# Patient Record
Sex: Male | Born: 1940
Health system: Southern US, Community
[De-identification: ages and names within clinical notes are randomized; demographics above are authoritative.]

## PROBLEM LIST (undated history)

## (undated) DIAGNOSIS — K219 Gastro-esophageal reflux disease without esophagitis: Secondary | ICD-10-CM

## (undated) DIAGNOSIS — B029 Zoster without complications: Secondary | ICD-10-CM

## (undated) DIAGNOSIS — M4800 Spinal stenosis, site unspecified: Secondary | ICD-10-CM

## (undated) DIAGNOSIS — M542 Cervicalgia: Secondary | ICD-10-CM

## (undated) DIAGNOSIS — S22079A Unspecified fracture of T9-T10 vertebra, initial encounter for closed fracture: Secondary | ICD-10-CM

## (undated) DIAGNOSIS — L719 Rosacea, unspecified: Secondary | ICD-10-CM

## (undated) DIAGNOSIS — G8929 Other chronic pain: Secondary | ICD-10-CM

## (undated) DIAGNOSIS — M1990 Unspecified osteoarthritis, unspecified site: Secondary | ICD-10-CM

## (undated) DIAGNOSIS — H409 Unspecified glaucoma: Secondary | ICD-10-CM

## (undated) DIAGNOSIS — N200 Calculus of kidney: Secondary | ICD-10-CM

## (undated) DIAGNOSIS — I10 Essential (primary) hypertension: Secondary | ICD-10-CM

## (undated) HISTORY — PX: ESOPHAGOGASTRODUODENOSCOPY: SHX1529

## (undated) HISTORY — DX: Unspecified osteoarthritis, unspecified site: M19.90

## (undated) HISTORY — DX: Other chronic pain: G89.29

## (undated) HISTORY — DX: Dorsalgia, unspecified: M54.2

## (undated) HISTORY — DX: Gastro-esophageal reflux disease without esophagitis: K21.9

## (undated) HISTORY — DX: Calculus of kidney: N20.0

## (undated) HISTORY — DX: Essential (primary) hypertension: I10

## (undated) HISTORY — DX: Rosacea, unspecified: L71.9

## (undated) HISTORY — DX: Zoster without complications: B02.9

## (undated) HISTORY — DX: Unspecified glaucoma: H40.9

## (undated) HISTORY — PX: COLONOSCOPY: SHX174

## (undated) HISTORY — DX: Unspecified fracture of t9-t10 vertebra, initial encounter for closed fracture: S22.079A

## (undated) HISTORY — DX: Spinal stenosis, site unspecified: M48.00

## (undated) HISTORY — PX: APPENDECTOMY: SHX54

---

## 1998-02-21 ENCOUNTER — Ambulatory Visit (HOSPITAL_COMMUNITY): Admission: RE | Admit: 1998-02-21 | Discharge: 1998-02-21 | Payer: Self-pay | Admitting: Gastroenterology

## 1998-03-06 ENCOUNTER — Ambulatory Visit (HOSPITAL_COMMUNITY): Admission: RE | Admit: 1998-03-06 | Discharge: 1998-03-06 | Payer: Self-pay | Admitting: Gastroenterology

## 1998-03-06 ENCOUNTER — Encounter: Payer: Self-pay | Admitting: Gastroenterology

## 1998-04-22 ENCOUNTER — Encounter: Payer: Self-pay | Admitting: Gastroenterology

## 1998-04-22 ENCOUNTER — Ambulatory Visit (HOSPITAL_COMMUNITY): Admission: RE | Admit: 1998-04-22 | Discharge: 1998-04-22 | Payer: Self-pay | Admitting: Gastroenterology

## 1998-06-06 ENCOUNTER — Ambulatory Visit (HOSPITAL_COMMUNITY): Admission: RE | Admit: 1998-06-06 | Discharge: 1998-06-06 | Payer: Self-pay | Admitting: Gastroenterology

## 1998-06-06 ENCOUNTER — Encounter: Payer: Self-pay | Admitting: Gastroenterology

## 2000-11-23 ENCOUNTER — Ambulatory Visit (HOSPITAL_COMMUNITY): Admission: RE | Admit: 2000-11-23 | Discharge: 2000-11-23 | Payer: Self-pay | Admitting: Gastroenterology

## 2000-11-23 ENCOUNTER — Encounter (INDEPENDENT_AMBULATORY_CARE_PROVIDER_SITE_OTHER): Payer: Self-pay | Admitting: *Deleted

## 2000-12-02 ENCOUNTER — Emergency Department (HOSPITAL_COMMUNITY): Admission: EM | Admit: 2000-12-02 | Discharge: 2000-12-02 | Payer: Self-pay | Admitting: Emergency Medicine

## 2000-12-02 ENCOUNTER — Encounter: Payer: Self-pay | Admitting: Emergency Medicine

## 2001-10-21 ENCOUNTER — Encounter: Payer: Self-pay | Admitting: Gastroenterology

## 2001-10-21 ENCOUNTER — Ambulatory Visit (HOSPITAL_COMMUNITY): Admission: RE | Admit: 2001-10-21 | Discharge: 2001-10-21 | Payer: Self-pay | Admitting: Gastroenterology

## 2001-12-09 ENCOUNTER — Ambulatory Visit (HOSPITAL_COMMUNITY): Admission: RE | Admit: 2001-12-09 | Discharge: 2001-12-09 | Payer: Self-pay | Admitting: Gastroenterology

## 2001-12-09 ENCOUNTER — Encounter: Payer: Self-pay | Admitting: Gastroenterology

## 2003-09-14 ENCOUNTER — Encounter (INDEPENDENT_AMBULATORY_CARE_PROVIDER_SITE_OTHER): Payer: Self-pay | Admitting: Specialist

## 2003-09-14 ENCOUNTER — Ambulatory Visit (HOSPITAL_COMMUNITY): Admission: RE | Admit: 2003-09-14 | Discharge: 2003-09-14 | Payer: Self-pay | Admitting: Gastroenterology

## 2004-10-29 ENCOUNTER — Encounter: Admission: RE | Admit: 2004-10-29 | Discharge: 2004-10-29 | Payer: Self-pay | Admitting: Gastroenterology

## 2005-08-04 ENCOUNTER — Encounter: Admission: RE | Admit: 2005-08-04 | Discharge: 2005-08-04 | Payer: Self-pay | Admitting: Family Medicine

## 2005-08-07 ENCOUNTER — Encounter: Admission: RE | Admit: 2005-08-07 | Discharge: 2005-08-07 | Payer: Self-pay | Admitting: Family Medicine

## 2005-10-13 ENCOUNTER — Encounter: Admission: RE | Admit: 2005-10-13 | Discharge: 2005-11-06 | Payer: Self-pay | Admitting: Family Medicine

## 2005-10-29 ENCOUNTER — Encounter: Admission: RE | Admit: 2005-10-29 | Discharge: 2005-10-29 | Payer: Self-pay | Admitting: Family Medicine

## 2005-11-24 ENCOUNTER — Encounter: Admission: RE | Admit: 2005-11-24 | Discharge: 2005-11-24 | Payer: Self-pay | Admitting: Family Medicine

## 2010-01-26 ENCOUNTER — Encounter: Payer: Self-pay | Admitting: Family Medicine

## 2010-05-23 NOTE — Op Note (Signed)
NAME:  Dylan Pierce, Dylan Pierce                         ACCOUNT NO.:  [id number]   MEDICAL RECORD NO.:  [id number]                   PATIENT TYPE:  AMB   LOCATION:  ENDO                                 FACILITY:  [facility]   PHYSICIAN:  Petra Kuba, M.D.                 DATE OF BIRTH:  [birth year]   DATE OF PROCEDURE:  09/14/2003  DATE OF DISCHARGE:                                 OPERATIVE REPORT   PROCEDURE:  Colonoscopy with polypectomy.   INDICATIONS:  Chronic right upper quadrant pain, due for colonic screening.   Consent was signed after risks, benefits, methods, options thoroughly  discussed in the office.   MEDICINES USED:  Demerol 70, Versed 7.   PROCEDURE:  Rectal inspection is pertinent for external hemorrhoids.  Digital exam was negative.  The video colonoscope was inserted and with some  difficulty due to some looping, with rolling on his back and with abdominal  pressure, was able to be advanced to the cecum.  On insertion, some left-  sided diverticula were seen but no other abnormalities.  The cecum was  identified by the appendiceal orifice and ileocecal valve.  The scope was  insert a shortways into the terminal ileum, which was normal.  Photo  documentation was obtained.  The scope was slowly withdrawn.  The prep was  adequate.  There was some liquid stool that required washing and suctioning.  Slow withdrawal through the colon, the cecum, and the ascending was normal.  In the hepatic flexure, a small pedunculated polyp was seen.  Snare  electrocautery applied.  The polyp was suctioned through the scope and  collected in a trap.  Just next to this polyp was a tiny polyp that was hot  biopsied x1 and put in the same container.  The scope was slowly withdrawn.  In the mid transverse, another tiny polyp was seen and was hot biopsied x1  and put in the same container.  On slow withdrawal through the remaining  part of the colon other than some left-sided diverticula, no other  abnormalities were seen.  Anorectal pull-through and retroflexion revealed  some small hemorrhoids.  The scope was straightened and readvanced shortways  up the left side of the colon.  Air was suctioned, and the scope removed.  Patient tolerated the procedure adequately.  There was no obvious immediate  complication.   ENDOSCOPIC DIAGNOSES:  1.  Internal and external hemorrhoids.  2.  Left-sided diverticula.  3.  Two tiny hepatic flexure and mid transverse polyps, hot biopsied.  4.  One small hepatic flexure polyp snare.  5.  Otherwise within normal limits to the terminal ileum.   PLAN:  Await pathology to determine future colonic screening.  Consider  repeat workup with either ultrasound, CAT scan, or possibly even a  pericholecystectomy.  Be happy to see back p.r.n., otherwise return to the  care of Dr. Dorothe Pea for other customary  health-care maintenance, to include  yearly rectals and guaiacs.                                               Petra Kuba, M.D.    MEM/MEDQ  D:  09/14/2003  T:  09/14/2003  Job:  [id number]   cc:   Jethro Bastos, M.D.  [address]  [place]  [place] [address]  Fax: [phone]

## 2010-05-23 NOTE — Procedures (Signed)
[facility]  Patient:    Dylan Pierce, Dylan Pierce Visit Number: [id number] MRN: [id number]          Service Type: END Location: ENDO Attending Physician:  Nelda Marseille Dictated by:   Petra Kuba, M.D. Proc. Date: 11/23/00 Admit Date:  11/23/2000   CC:         Jethro Bastos, M.D.   Procedure Report  PROCEDURE:  Esophagogastroduodenoscopy with pediatric colonoscope and biopsy.  SURGEON:  Petra Kuba, M.D.  INDICATIONS:  Patient with a borderline positive sprue antibody.  Multiple GI complaints.  Want to rule out sprue.  Consent was signed after risks, benefits, methods, and options were thoroughly discussed in the office on multiple occasions.  MEDICINES USED:  Demerol 50 and Versed 5.  DESCRIPTION OF PROCEDURE:  The pediatric video colonoscope was inserted by direct vision into the esophagus and easily advanced through his stomach through a normal pylorus into the duodenal bulb.  Pertinent for some mild bulbitis around the cilia to a normal duodenum, and around the duodenum to the proximal jejunum.  Photo documentation and biopsies were obtained.  On slow withdrawal back to the bulb, a few scattered duodenal biopsies were obtained as well.  There was no signs of obvious sprue.  The bulb was pertinent for some mild bulbitis, but no other abnormalities.  Once back in the stomach, the scope was retroflexed.  Pertinent for normal cardia, fundus, and angularis. Lesser and greater curve pertinent for some minimal gastritis.  The scope was straightened and straight visualization of the stomach confirmed some mild antritis and gastritis, but ruled out any other abnormalities.  Scattered biopsies of the stomach were obtained to rule out Helicobacter and confirm the gastritis.  Air was suctioned and the scope was slowly withdrawn.  Again, a good look at the esophagus was normal.  The scope was removed.  The patient tolerated the procedure well.   There was no obvious or immediate complication.  ENDOSCOPIC DIAGNOSES: 1. Small hiatal hernia. 2. Minimal gastritis, antritis, biopsy, and bulbitis, minimal. 3. Otherwise within normal limits to the proximal jejunum, status post biopsy    and duodenal biopsy to rule out sprue.  PLAN:  Await pathology, continue present management, consider even if biopsy is negative, and _________ for two months to see if it makes a difference, and will discuss that based on the biopsies.  Call me sooner p.r.n. Dictated by:   Petra Kuba, M.D. Attending Physician:  Nelda Marseille DD:  11/23/00 TD:  11/23/00 Job: [id number]

## 2011-02-10 ENCOUNTER — Encounter (INDEPENDENT_AMBULATORY_CARE_PROVIDER_SITE_OTHER): Payer: Medicare Other | Admitting: Ophthalmology

## 2011-02-10 DIAGNOSIS — H3530 Unspecified macular degeneration: Secondary | ICD-10-CM

## 2011-02-10 DIAGNOSIS — D3130 Benign neoplasm of unspecified choroid: Secondary | ICD-10-CM

## 2011-02-10 DIAGNOSIS — H35039 Hypertensive retinopathy, unspecified eye: Secondary | ICD-10-CM

## 2011-02-10 DIAGNOSIS — H4010X0 Unspecified open-angle glaucoma, stage unspecified: Secondary | ICD-10-CM

## 2011-02-10 DIAGNOSIS — H43819 Vitreous degeneration, unspecified eye: Secondary | ICD-10-CM

## 2011-02-10 DIAGNOSIS — I10 Essential (primary) hypertension: Secondary | ICD-10-CM

## 2012-02-01 ENCOUNTER — Other Ambulatory Visit: Payer: Self-pay | Admitting: Gastroenterology

## 2012-02-11 ENCOUNTER — Ambulatory Visit (INDEPENDENT_AMBULATORY_CARE_PROVIDER_SITE_OTHER): Payer: Medicare Other | Admitting: Ophthalmology

## 2013-02-24 ENCOUNTER — Other Ambulatory Visit: Payer: Self-pay | Admitting: Family Medicine

## 2013-02-24 ENCOUNTER — Ambulatory Visit
Admission: RE | Admit: 2013-02-24 | Discharge: 2013-02-24 | Disposition: A | Discharge status: Home / Self Care | Payer: Medicare Other | Source: Ambulatory Visit | Attending: Family Medicine | Admitting: Family Medicine

## 2013-02-24 DIAGNOSIS — R1032 Left lower quadrant pain: Secondary | ICD-10-CM

## 2013-02-24 MED ORDER — IOHEXOL 300 MG/ML  SOLN
100.0000 mL | Freq: Once | INTRAMUSCULAR | Status: AC | PRN
  Administered 2013-02-24: 100 mL via INTRAVENOUS

## 2013-05-18 ENCOUNTER — Other Ambulatory Visit: Payer: Self-pay | Admitting: Family Medicine

## 2013-05-18 DIAGNOSIS — M545 Low back pain, unspecified: Secondary | ICD-10-CM

## 2013-05-18 DIAGNOSIS — M5430 Sciatica, unspecified side: Secondary | ICD-10-CM

## 2013-05-26 ENCOUNTER — Ambulatory Visit
Admission: RE | Admit: 2013-05-26 | Discharge: 2013-05-26 | Disposition: A | Discharge status: Home / Self Care | Payer: Medicare Other | Source: Ambulatory Visit | Attending: Family Medicine | Admitting: Family Medicine

## 2013-05-26 DIAGNOSIS — M5430 Sciatica, unspecified side: Secondary | ICD-10-CM

## 2013-05-26 DIAGNOSIS — M545 Low back pain, unspecified: Secondary | ICD-10-CM

## 2014-09-03 ENCOUNTER — Other Ambulatory Visit: Payer: Self-pay | Admitting: Family Medicine

## 2014-09-03 ENCOUNTER — Ambulatory Visit
Admission: RE | Admit: 2014-09-03 | Discharge: 2014-09-03 | Disposition: A | Discharge status: Home / Self Care | Payer: Medicare Other | Source: Ambulatory Visit | Attending: Family Medicine | Admitting: Family Medicine

## 2014-09-03 DIAGNOSIS — J209 Acute bronchitis, unspecified: Secondary | ICD-10-CM

## 2015-05-28 DIAGNOSIS — L719 Rosacea, unspecified: Secondary | ICD-10-CM | POA: Diagnosis not present

## 2015-05-28 DIAGNOSIS — J309 Allergic rhinitis, unspecified: Secondary | ICD-10-CM | POA: Diagnosis not present

## 2015-05-28 DIAGNOSIS — G4700 Insomnia, unspecified: Secondary | ICD-10-CM | POA: Diagnosis not present

## 2015-05-28 DIAGNOSIS — E663 Overweight: Secondary | ICD-10-CM | POA: Diagnosis not present

## 2015-05-28 DIAGNOSIS — D582 Other hemoglobinopathies: Secondary | ICD-10-CM | POA: Diagnosis not present

## 2015-05-28 DIAGNOSIS — I10 Essential (primary) hypertension: Secondary | ICD-10-CM | POA: Diagnosis not present

## 2015-05-28 DIAGNOSIS — M545 Low back pain: Secondary | ICD-10-CM | POA: Diagnosis not present

## 2015-05-28 DIAGNOSIS — K219 Gastro-esophageal reflux disease without esophagitis: Secondary | ICD-10-CM | POA: Diagnosis not present

## 2015-06-04 DIAGNOSIS — H401221 Low-tension glaucoma, left eye, mild stage: Secondary | ICD-10-CM | POA: Diagnosis not present

## 2015-06-04 DIAGNOSIS — H2513 Age-related nuclear cataract, bilateral: Secondary | ICD-10-CM | POA: Diagnosis not present

## 2015-06-04 DIAGNOSIS — H524 Presbyopia: Secondary | ICD-10-CM | POA: Diagnosis not present

## 2015-06-04 DIAGNOSIS — H401213 Low-tension glaucoma, right eye, severe stage: Secondary | ICD-10-CM | POA: Diagnosis not present

## 2015-11-15 DIAGNOSIS — Z23 Encounter for immunization: Secondary | ICD-10-CM | POA: Diagnosis not present

## 2015-12-03 DIAGNOSIS — K219 Gastro-esophageal reflux disease without esophagitis: Secondary | ICD-10-CM | POA: Diagnosis not present

## 2015-12-03 DIAGNOSIS — Z0000 Encounter for general adult medical examination without abnormal findings: Secondary | ICD-10-CM | POA: Diagnosis not present

## 2015-12-03 DIAGNOSIS — I10 Essential (primary) hypertension: Secondary | ICD-10-CM | POA: Diagnosis not present

## 2015-12-03 DIAGNOSIS — Z1211 Encounter for screening for malignant neoplasm of colon: Secondary | ICD-10-CM | POA: Diagnosis not present

## 2015-12-03 DIAGNOSIS — L719 Rosacea, unspecified: Secondary | ICD-10-CM | POA: Diagnosis not present

## 2015-12-03 DIAGNOSIS — J309 Allergic rhinitis, unspecified: Secondary | ICD-10-CM | POA: Diagnosis not present

## 2015-12-03 DIAGNOSIS — Z8719 Personal history of other diseases of the digestive system: Secondary | ICD-10-CM | POA: Diagnosis not present

## 2015-12-03 DIAGNOSIS — H409 Unspecified glaucoma: Secondary | ICD-10-CM | POA: Diagnosis not present

## 2015-12-03 DIAGNOSIS — M545 Low back pain: Secondary | ICD-10-CM | POA: Diagnosis not present

## 2015-12-03 DIAGNOSIS — G4700 Insomnia, unspecified: Secondary | ICD-10-CM | POA: Diagnosis not present

## 2015-12-10 DIAGNOSIS — H401322 Pigmentary glaucoma, left eye, moderate stage: Secondary | ICD-10-CM | POA: Diagnosis not present

## 2015-12-10 DIAGNOSIS — H401313 Pigmentary glaucoma, right eye, severe stage: Secondary | ICD-10-CM | POA: Diagnosis not present

## 2016-02-18 DIAGNOSIS — R040 Epistaxis: Secondary | ICD-10-CM | POA: Diagnosis not present

## 2016-02-18 DIAGNOSIS — J343 Hypertrophy of nasal turbinates: Secondary | ICD-10-CM | POA: Diagnosis not present

## 2016-05-05 DIAGNOSIS — H353112 Nonexudative age-related macular degeneration, right eye, intermediate dry stage: Secondary | ICD-10-CM | POA: Diagnosis not present

## 2016-05-05 DIAGNOSIS — H401321 Pigmentary glaucoma, left eye, mild stage: Secondary | ICD-10-CM | POA: Diagnosis not present

## 2016-05-05 DIAGNOSIS — H25013 Cortical age-related cataract, bilateral: Secondary | ICD-10-CM | POA: Diagnosis not present

## 2016-05-05 DIAGNOSIS — H401313 Pigmentary glaucoma, right eye, severe stage: Secondary | ICD-10-CM | POA: Diagnosis not present

## 2016-06-02 DIAGNOSIS — I10 Essential (primary) hypertension: Secondary | ICD-10-CM | POA: Diagnosis not present

## 2016-06-02 DIAGNOSIS — G4700 Insomnia, unspecified: Secondary | ICD-10-CM | POA: Diagnosis not present

## 2016-06-02 DIAGNOSIS — M545 Low back pain: Secondary | ICD-10-CM | POA: Diagnosis not present

## 2016-06-02 DIAGNOSIS — K219 Gastro-esophageal reflux disease without esophagitis: Secondary | ICD-10-CM | POA: Diagnosis not present

## 2016-06-16 DIAGNOSIS — H401313 Pigmentary glaucoma, right eye, severe stage: Secondary | ICD-10-CM | POA: Diagnosis not present

## 2016-10-29 DIAGNOSIS — Z23 Encounter for immunization: Secondary | ICD-10-CM | POA: Diagnosis not present

## 2016-12-07 DIAGNOSIS — Z0000 Encounter for general adult medical examination without abnormal findings: Secondary | ICD-10-CM | POA: Diagnosis not present

## 2016-12-07 DIAGNOSIS — I10 Essential (primary) hypertension: Secondary | ICD-10-CM | POA: Diagnosis not present

## 2016-12-07 DIAGNOSIS — G4700 Insomnia, unspecified: Secondary | ICD-10-CM | POA: Diagnosis not present

## 2016-12-07 DIAGNOSIS — K219 Gastro-esophageal reflux disease without esophagitis: Secondary | ICD-10-CM | POA: Diagnosis not present

## 2017-01-13 DIAGNOSIS — R0781 Pleurodynia: Secondary | ICD-10-CM | POA: Diagnosis not present

## 2017-01-25 ENCOUNTER — Ambulatory Visit
Admission: RE | Admit: 2017-01-25 | Discharge: 2017-01-25 | Disposition: A | Discharge status: Home / Self Care | Payer: Medicare Other | Source: Ambulatory Visit | Attending: Family Medicine | Admitting: Family Medicine

## 2017-01-25 ENCOUNTER — Other Ambulatory Visit: Payer: Self-pay | Admitting: Family Medicine

## 2017-01-25 DIAGNOSIS — I10 Essential (primary) hypertension: Secondary | ICD-10-CM | POA: Diagnosis not present

## 2017-01-25 DIAGNOSIS — M47814 Spondylosis without myelopathy or radiculopathy, thoracic region: Secondary | ICD-10-CM | POA: Diagnosis not present

## 2017-01-25 DIAGNOSIS — M546 Pain in thoracic spine: Secondary | ICD-10-CM

## 2017-01-25 DIAGNOSIS — R0781 Pleurodynia: Secondary | ICD-10-CM | POA: Diagnosis not present

## 2017-01-25 DIAGNOSIS — R0789 Other chest pain: Secondary | ICD-10-CM

## 2017-06-07 DIAGNOSIS — K219 Gastro-esophageal reflux disease without esophagitis: Secondary | ICD-10-CM | POA: Diagnosis not present

## 2017-06-07 DIAGNOSIS — M545 Low back pain: Secondary | ICD-10-CM | POA: Diagnosis not present

## 2017-06-07 DIAGNOSIS — I10 Essential (primary) hypertension: Secondary | ICD-10-CM | POA: Diagnosis not present

## 2017-06-07 DIAGNOSIS — G4700 Insomnia, unspecified: Secondary | ICD-10-CM | POA: Diagnosis not present

## 2017-06-11 DIAGNOSIS — H401321 Pigmentary glaucoma, left eye, mild stage: Secondary | ICD-10-CM | POA: Diagnosis not present

## 2017-06-11 DIAGNOSIS — H401313 Pigmentary glaucoma, right eye, severe stage: Secondary | ICD-10-CM | POA: Diagnosis not present

## 2017-10-12 DIAGNOSIS — H401321 Pigmentary glaucoma, left eye, mild stage: Secondary | ICD-10-CM | POA: Diagnosis not present

## 2017-10-12 DIAGNOSIS — H25013 Cortical age-related cataract, bilateral: Secondary | ICD-10-CM | POA: Diagnosis not present

## 2017-10-12 DIAGNOSIS — H2513 Age-related nuclear cataract, bilateral: Secondary | ICD-10-CM | POA: Diagnosis not present

## 2017-10-12 DIAGNOSIS — H25043 Posterior subcapsular polar age-related cataract, bilateral: Secondary | ICD-10-CM | POA: Diagnosis not present

## 2017-10-21 DIAGNOSIS — H25012 Cortical age-related cataract, left eye: Secondary | ICD-10-CM | POA: Diagnosis not present

## 2017-10-21 DIAGNOSIS — H25812 Combined forms of age-related cataract, left eye: Secondary | ICD-10-CM | POA: Diagnosis not present

## 2017-10-21 DIAGNOSIS — H25042 Posterior subcapsular polar age-related cataract, left eye: Secondary | ICD-10-CM | POA: Diagnosis not present

## 2017-10-21 DIAGNOSIS — H2512 Age-related nuclear cataract, left eye: Secondary | ICD-10-CM | POA: Diagnosis not present

## 2017-11-04 DIAGNOSIS — H25041 Posterior subcapsular polar age-related cataract, right eye: Secondary | ICD-10-CM | POA: Diagnosis not present

## 2017-11-04 DIAGNOSIS — H25811 Combined forms of age-related cataract, right eye: Secondary | ICD-10-CM | POA: Diagnosis not present

## 2017-11-04 DIAGNOSIS — H2511 Age-related nuclear cataract, right eye: Secondary | ICD-10-CM | POA: Diagnosis not present

## 2017-11-04 DIAGNOSIS — H25011 Cortical age-related cataract, right eye: Secondary | ICD-10-CM | POA: Diagnosis not present

## 2017-12-10 DIAGNOSIS — Z961 Presence of intraocular lens: Secondary | ICD-10-CM | POA: Diagnosis not present

## 2018-01-11 DIAGNOSIS — I10 Essential (primary) hypertension: Secondary | ICD-10-CM | POA: Diagnosis not present

## 2018-01-11 DIAGNOSIS — M545 Low back pain: Secondary | ICD-10-CM | POA: Diagnosis not present

## 2018-01-11 DIAGNOSIS — G4700 Insomnia, unspecified: Secondary | ICD-10-CM | POA: Diagnosis not present

## 2018-01-11 DIAGNOSIS — Z0000 Encounter for general adult medical examination without abnormal findings: Secondary | ICD-10-CM | POA: Diagnosis not present

## 2018-05-09 DIAGNOSIS — H401313 Pigmentary glaucoma, right eye, severe stage: Secondary | ICD-10-CM | POA: Diagnosis not present

## 2018-05-09 DIAGNOSIS — H401321 Pigmentary glaucoma, left eye, mild stage: Secondary | ICD-10-CM | POA: Diagnosis not present

## 2018-05-09 DIAGNOSIS — H00012 Hordeolum externum right lower eyelid: Secondary | ICD-10-CM | POA: Diagnosis not present

## 2018-07-14 DIAGNOSIS — M545 Low back pain: Secondary | ICD-10-CM | POA: Diagnosis not present

## 2018-07-14 DIAGNOSIS — G4700 Insomnia, unspecified: Secondary | ICD-10-CM | POA: Diagnosis not present

## 2018-07-14 DIAGNOSIS — I10 Essential (primary) hypertension: Secondary | ICD-10-CM | POA: Diagnosis not present

## 2018-07-14 DIAGNOSIS — K5792 Diverticulitis of intestine, part unspecified, without perforation or abscess without bleeding: Secondary | ICD-10-CM | POA: Diagnosis not present

## 2018-09-13 DIAGNOSIS — H43813 Vitreous degeneration, bilateral: Secondary | ICD-10-CM | POA: Diagnosis not present

## 2018-09-13 DIAGNOSIS — H401313 Pigmentary glaucoma, right eye, severe stage: Secondary | ICD-10-CM | POA: Diagnosis not present

## 2018-09-13 DIAGNOSIS — H353112 Nonexudative age-related macular degeneration, right eye, intermediate dry stage: Secondary | ICD-10-CM | POA: Diagnosis not present

## 2019-01-23 DIAGNOSIS — Z0000 Encounter for general adult medical examination without abnormal findings: Secondary | ICD-10-CM | POA: Diagnosis not present

## 2019-01-23 DIAGNOSIS — I10 Essential (primary) hypertension: Secondary | ICD-10-CM | POA: Diagnosis not present

## 2019-01-23 DIAGNOSIS — G4700 Insomnia, unspecified: Secondary | ICD-10-CM | POA: Diagnosis not present

## 2019-01-23 DIAGNOSIS — M545 Low back pain: Secondary | ICD-10-CM | POA: Diagnosis not present

## 2019-02-09 DIAGNOSIS — I10 Essential (primary) hypertension: Secondary | ICD-10-CM | POA: Diagnosis not present

## 2019-02-18 ENCOUNTER — Ambulatory Visit: Payer: Medicare Other | Attending: Internal Medicine

## 2019-02-18 DIAGNOSIS — Z23 Encounter for immunization: Secondary | ICD-10-CM | POA: Insufficient documentation

## 2019-02-18 NOTE — Progress Notes (Signed)
   Covid-19 Vaccination Clinic  Name:  Dylan Pierce    MRN: [id number] DOB: [birth year]  02/18/2019  [name] was observed post Covid-19 immunization for 15 minutes without incidence. He was provided with Vaccine Information Sheet and instruction to access the V-Safe system.   [name] was instructed to call 911 with any severe reactions post vaccine: Marland Kitchen Difficulty breathing  . Swelling of your face and throat  . A fast heartbeat  . A bad rash all over your body  . Dizziness and weakness    Immunizations Administered    Name Date Dose VIS Date Route   Pfizer COVID-19 Vaccine 02/18/2019  9:39 AM 0.3 mL 12/16/2018 Intramuscular   Manufacturer: [place]   Lot: [id number]   [place]: [id number]

## 2019-03-08 DIAGNOSIS — H401321 Pigmentary glaucoma, left eye, mild stage: Secondary | ICD-10-CM | POA: Diagnosis not present

## 2019-03-08 DIAGNOSIS — H401313 Pigmentary glaucoma, right eye, severe stage: Secondary | ICD-10-CM | POA: Diagnosis not present

## 2019-03-12 ENCOUNTER — Ambulatory Visit: Payer: Medicare Other | Attending: Internal Medicine

## 2019-03-12 DIAGNOSIS — Z23 Encounter for immunization: Secondary | ICD-10-CM | POA: Insufficient documentation

## 2019-03-12 NOTE — Progress Notes (Signed)
   Covid-19 Vaccination Clinic  Name:  Dylan Pierce    MRN: [id number] DOB: April 01, 1940  03/12/2019  Dylan Pierce was observed post Covid-19 immunization for 15 minutes without incident. He was provided with Vaccine Information Sheet and instruction to access the V-Safe system.   Dylan Pierce was instructed to call 911 with any severe reactions post vaccine: Marland Kitchen Difficulty breathing  . Swelling of face and throat  . A fast heartbeat  . A bad rash all over body  . Dizziness and weakness   Immunizations Administered    Name Date Dose VIS Date Route   Pfizer COVID-19 Vaccine 03/12/2019  2:19 PM 0.3 mL 12/16/2018 Intramuscular   Manufacturer: [place]   Lot: [id number]   [place]: [id number]

## 2019-04-06 DIAGNOSIS — Z0120 Encounter for dental examination and cleaning without abnormal findings: Secondary | ICD-10-CM | POA: Diagnosis not present

## 2019-04-18 DIAGNOSIS — D582 Other hemoglobinopathies: Secondary | ICD-10-CM | POA: Diagnosis not present

## 2019-04-18 DIAGNOSIS — G4700 Insomnia, unspecified: Secondary | ICD-10-CM | POA: Diagnosis not present

## 2019-04-18 DIAGNOSIS — H409 Unspecified glaucoma: Secondary | ICD-10-CM | POA: Diagnosis not present

## 2019-04-18 DIAGNOSIS — I10 Essential (primary) hypertension: Secondary | ICD-10-CM | POA: Diagnosis not present

## 2019-05-05 DIAGNOSIS — R040 Epistaxis: Secondary | ICD-10-CM | POA: Diagnosis not present

## 2019-05-30 DIAGNOSIS — R040 Epistaxis: Secondary | ICD-10-CM | POA: Diagnosis not present

## 2019-06-28 DIAGNOSIS — R040 Epistaxis: Secondary | ICD-10-CM | POA: Diagnosis not present

## 2019-09-01 DIAGNOSIS — G4700 Insomnia, unspecified: Secondary | ICD-10-CM | POA: Diagnosis not present

## 2019-09-01 DIAGNOSIS — M545 Low back pain: Secondary | ICD-10-CM | POA: Diagnosis not present

## 2019-09-01 DIAGNOSIS — L719 Rosacea, unspecified: Secondary | ICD-10-CM | POA: Diagnosis not present

## 2019-09-01 DIAGNOSIS — I10 Essential (primary) hypertension: Secondary | ICD-10-CM | POA: Diagnosis not present

## 2019-09-05 DIAGNOSIS — I10 Essential (primary) hypertension: Secondary | ICD-10-CM | POA: Diagnosis not present

## 2019-09-05 DIAGNOSIS — G4700 Insomnia, unspecified: Secondary | ICD-10-CM | POA: Diagnosis not present

## 2019-09-05 DIAGNOSIS — D582 Other hemoglobinopathies: Secondary | ICD-10-CM | POA: Diagnosis not present

## 2019-09-05 DIAGNOSIS — H409 Unspecified glaucoma: Secondary | ICD-10-CM | POA: Diagnosis not present

## 2019-09-13 DIAGNOSIS — H26493 Other secondary cataract, bilateral: Secondary | ICD-10-CM | POA: Diagnosis not present

## 2019-09-13 DIAGNOSIS — H401313 Pigmentary glaucoma, right eye, severe stage: Secondary | ICD-10-CM | POA: Diagnosis not present

## 2019-09-13 DIAGNOSIS — H35372 Puckering of macula, left eye: Secondary | ICD-10-CM | POA: Diagnosis not present

## 2019-09-13 DIAGNOSIS — H524 Presbyopia: Secondary | ICD-10-CM | POA: Diagnosis not present

## 2019-10-05 DIAGNOSIS — H26491 Other secondary cataract, right eye: Secondary | ICD-10-CM | POA: Diagnosis not present

## 2019-11-16 DIAGNOSIS — H26492 Other secondary cataract, left eye: Secondary | ICD-10-CM | POA: Diagnosis not present
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
## 2020-02-09 DIAGNOSIS — L719 Rosacea, unspecified: Secondary | ICD-10-CM | POA: Diagnosis not present

## 2020-02-09 DIAGNOSIS — I10 Essential (primary) hypertension: Secondary | ICD-10-CM | POA: Diagnosis not present

## 2020-02-09 DIAGNOSIS — Z0000 Encounter for general adult medical examination without abnormal findings: Secondary | ICD-10-CM | POA: Diagnosis not present

## 2020-02-09 DIAGNOSIS — G4700 Insomnia, unspecified: Secondary | ICD-10-CM | POA: Diagnosis not present

## 2020-03-22 DIAGNOSIS — G8929 Other chronic pain: Secondary | ICD-10-CM | POA: Diagnosis not present

## 2020-03-22 DIAGNOSIS — Z6828 Body mass index (BMI) 28.0-28.9, adult: Secondary | ICD-10-CM | POA: Diagnosis not present

## 2020-03-22 DIAGNOSIS — M545 Low back pain, unspecified: Secondary | ICD-10-CM | POA: Diagnosis not present

## 2020-03-22 DIAGNOSIS — I10 Essential (primary) hypertension: Secondary | ICD-10-CM | POA: Diagnosis not present

## 2020-04-02 ENCOUNTER — Ambulatory Visit: Payer: Medicare Other | Attending: Neurosurgery | Admitting: Physical Therapy

## 2020-04-02 ENCOUNTER — Encounter: Payer: Self-pay | Admitting: Physical Therapy

## 2020-04-02 ENCOUNTER — Other Ambulatory Visit: Payer: Self-pay

## 2020-04-02 DIAGNOSIS — G8929 Other chronic pain: Secondary | ICD-10-CM | POA: Diagnosis not present

## 2020-04-02 DIAGNOSIS — M545 Low back pain, unspecified: Secondary | ICD-10-CM | POA: Diagnosis not present

## 2020-04-02 DIAGNOSIS — R252 Cramp and spasm: Secondary | ICD-10-CM | POA: Diagnosis not present
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
## 2020-04-02 NOTE — Addendum Note (Signed)
Addended by: Abelino Derrick on: 04/02/2020 02:37 PM   Modules accepted: Orders

## 2020-04-04 ENCOUNTER — Encounter: Payer: Self-pay | Admitting: Physical Therapy

## 2020-04-04 ENCOUNTER — Other Ambulatory Visit: Payer: Self-pay

## 2020-04-04 ENCOUNTER — Ambulatory Visit: Payer: Medicare Other | Admitting: Physical Therapy

## 2020-04-04 DIAGNOSIS — M545 Low back pain, unspecified: Secondary | ICD-10-CM | POA: Diagnosis not present

## 2020-04-04 DIAGNOSIS — G8929 Other chronic pain: Secondary | ICD-10-CM

## 2020-04-04 DIAGNOSIS — R252 Cramp and spasm: Secondary | ICD-10-CM

## 2020-04-04 NOTE — Therapy (Signed)
[facility] [address] [place], [place], [address] Phone: [phone]   Fax:  [phone]  Physical Therapy Treatment  Patient Details  Name: Dylan Pierce MRN: [id number] Date of Birth: 1940-02-24 Referring Provider (PT): Duffy Rhody, MD   Encounter Date: 04/04/2020   PT End of Session - 04/04/20 0939    Visit Number 2    Number of Visits 16    Date for PT Re-Evaluation 05/28/20    Authorization Type Medicare    Progress Note Due on Visit 10    PT Start Time 0800    PT Stop Time 0846    PT Time Calculation (min) 46 min           Past Medical History:  Diagnosis Date  . Arthritis   . Hypertension     Past Surgical History:  Procedure Laterality Date  . APPENDECTOMY      There were no vitals filed for this visit.   Subjective Assessment - 04/04/20 0801    Pertinent History arthritis, hypertension    Limitations Lifting    How long can you sit comfortably? unlimited    How long can you stand comfortably? unlimited    How long can you walk comfortably? 20 minutes    Patient Stated Goals get back to yard work and to be able to continue to drive as wife is unable    Currently in Pain? Yes    Pain Score 4     Pain Location Back    Pain Orientation Right;Mid;Lower    Pain Descriptors / Indicators Sore    Pain Type Acute pain;Chronic pain    Pain Onset 1 to 4 weeks ago    Pain Frequency Constant    Multiple Pain Sites No                             [facility] Adult PT Treatment/Exercise - 04/04/20 0001      Exercises   Exercises Lumbar      Lumbar Exercises: Stretches   Active Hamstring Stretch Right;Left;20 seconds    Active Hamstring Stretch Limitations seated    Single Knee to Chest Stretch Right;Left;1 rep;20 seconds    Lower Trunk Rotation 5 reps    Lower Trunk Rotation Limitations bil    Piriformis Stretch Right;Left;1 rep;20 seconds    Other Lumbar Stretch  Exercise seated ball roll into flexion x10      Lumbar Exercises: Supine   Ab Set 10 reps;3 seconds    AB Set Limitations VC for continued breathing    Pelvic Tilt 5 reps;5 seconds    Pelvic Tilt Limitations TC for posterior tilt      Lumbar Exercises: Sidelying   Other Sidelying Lumbar Exercises thoracic rotation x3 bil      Modalities   Modalities Moist Heat      Moist Heat Therapy   Number Minutes Moist Heat 5 Minutes    Moist Heat Location Lumbar Spine   between thoracic and lumbar spine     Manual Therapy   Manual Therapy Soft tissue mobilization    Soft tissue mobilization skilled palpation for assessment of tPR and increased tension within muscle; assessement of tissues response during DN            Trigger Point Dry Needling - 04/04/20 0001    Consent Given? Yes    Education Handout Provided Previously provided   verbal education provided  previous session   Muscles Treated Back/Hip Lumbar multifidi;Thoracic multifidi    Other Dry Needling bil    Lumbar multifidi Response Palpable increased muscle length    Thoracic multifidi response Palpable increased muscle length                PT Education - 04/04/20 0939    Education Details Access Code: [facility]    Person(s) Educated Patient    Methods Explanation;Demonstration;Tactile cues;Verbal cues;Handout    Comprehension Verbalized understanding;Returned demonstration;Verbal cues required;Tactile cues required            PT Short Term Goals - 04/02/20 1417      PT SHORT TERM GOAL #1   Title Patient will be independent with HEP for continued progression at home.    Time 4    Period Weeks    Status New    Target Date 04/30/20      PT SHORT TERM GOAL #2   Title Patient will report no more than 3/10 pain at rest for improved functional activity tolerance    Time 4    Period Weeks    Status New    Target Date 04/30/20      PT SHORT TERM GOAL #3   Title Patient will demonstrate 50% lumbar extension  ROM to more readily complete ADLs.    Time 4    Period Weeks    Status New    Target Date 04/30/20             PT Long Term Goals - 04/02/20 1422      PT LONG TERM GOAL #1   Title Patient will be independent with advanced HEP for long term management of symptoms post D/C.    Time 8    Period Weeks    Status New    Target Date 05/28/20      PT LONG TERM GOAL #2   Title Patient will lift 10# from floor level using proper mechanics x5 reps without increased pain to more readily complete yardwork and homemaking duties.    Time 8    Period Weeks    Status New    Target Date 05/28/20      PT LONG TERM GOAL #3   Title Patient will improve FOTO score to 68 or higher to indicate improvd overall function.    Time 8    Period Weeks    Status New    Target Date 05/28/20      PT LONG TERM GOAL #4   Title Patient will roll Lt and Rt without increased pain for improved bed mobility.    Time 8    Period Weeks    Status New    Target Date 05/28/20                 Plan - 04/04/20 0940    Clinical Impression Statement Patient reports continued pain spanning L1 to T9. Continues to describe pain as soft tissue related. Noting increased sensitivity to lumbar rotation and extension this date. Patient reporting increased pain when rolling Lt/Rt. Reporting decreased pain when cued for ab set prior to rolling. Verbal cues for maintaing knee extension when performing seated hamstring stretch. Requiring pillow under torso to maintain prone positioning during DN. Would benefit from continued intervention to address impairments for decreaesd pain and improved functional mobility.    Personal Factors and Comorbidities Age;Comorbidity 2    Comorbidities arthritis, hypertension    Examination-Activity Limitations Bed Mobility;Bend;Lift    Examination-Participation  Restrictions Cleaning;Yard Work    Rehab Potential Good    PT Frequency 2x / week    PT Duration 8 weeks    PT  Treatment/Interventions ADLs/Self Care Home Management;Cryotherapy;Electrical Stimulation;Iontophoresis 4mg /ml Dexamethasone;Moist Heat;Traction;Functional mobility training;Therapeutic activities;Therapeutic exercise;Neuromuscular re-education;Patient/family education;Manual techniques;Dry needling;Passive range of motion    PT Next Visit Plan review HP; assess response to DN and continue as needed; continue lumbar mobility; add glute/core strength to patient tolerance    PT Home Exercise Plan Access Code: [email or web address]    Consulted and Agree with Plan of Care Patient           Patient will benefit from skilled therapeutic intervention in order to improve the following deficits and impairments:  Decreased activity tolerance,Decreased mobility,Decreased range of motion,Decreased strength,Hypomobility,Increased muscle spasms,Postural dysfunction  Visit Diagnosis: Cramp and spasm  Chronic bilateral low back pain without sciatica     Problem List There are no problems to display for this patient.  Everardo All PT, DPT  04/04/20 9:46 AM   [facility] [address], [place], [place], [address] Phone: [phone]   Fax:  [phone]  Name: Dylan Pierce MRN: [id number] Date of Birth: 1940-05-07

## 2020-04-04 NOTE — Patient Instructions (Signed)
Access Code: [facility] URL: [email or web address] Date: 04/04/2020 Prepared by: Everardo All  Exercises Supine Lower Trunk Rotation - 2 x daily - 7 x weekly - 1 sets - 10 reps Seated Hamstring Stretch - 2 x daily - 7 x weekly - 1 sets - 2 reps - 20s hold Supine Single Knee to Chest - 2 x daily - 7 x weekly - 1 sets - 2 reps - 20s hold Supine Piriformis Stretch with Leg Straight - 2 x daily - 7 x weekly - 1 sets - 2 reps - 20s hold

## 2020-04-10 DIAGNOSIS — H401313 Pigmentary glaucoma, right eye, severe stage: Secondary | ICD-10-CM | POA: Diagnosis not present

## 2020-04-11 ENCOUNTER — Other Ambulatory Visit: Payer: Self-pay

## 2020-04-11 ENCOUNTER — Encounter: Payer: Self-pay | Admitting: Physical Therapy

## 2020-04-11 ENCOUNTER — Ambulatory Visit: Payer: Medicare Other | Attending: Neurosurgery | Admitting: Physical Therapy

## 2020-04-11 DIAGNOSIS — G8929 Other chronic pain: Secondary | ICD-10-CM | POA: Diagnosis not present

## 2020-04-11 DIAGNOSIS — M545 Low back pain, unspecified: Secondary | ICD-10-CM | POA: Diagnosis not present

## 2020-04-11 DIAGNOSIS — R252 Cramp and spasm: Secondary | ICD-10-CM | POA: Diagnosis not present

## 2020-04-11 NOTE — Therapy (Signed)
[facility] [address], [place], [place], [address] Phone: [phone]   Fax:  [phone]  Physical Therapy Treatment  Patient Details  Name: Dylan Pierce MRN: [id number] Date of Birth: [birth year] Referring Provider (PT): Duffy Rhody, MD   Encounter Date: 04/11/2020   PT End of Session - 04/11/20 0934    Visit Number 3    Number of Visits 16    Date for PT Re-Evaluation 05/28/20    Authorization Type Medicare    Progress Note Due on Visit 10    PT Start Time 0931    PT Stop Time 1014    PT Time Calculation (min) 43 min    Activity Tolerance Patient tolerated treatment well    Behavior During Therapy [facility] for tasks assessed/performed           Past Medical History:  Diagnosis Date  . Arthritis   . Hypertension     Past Surgical History:  Procedure Laterality Date  . APPENDECTOMY      There were no vitals filed for this visit.   Subjective Assessment - 04/11/20 0931    Subjective Patient reports definite increased soreness following DN but this resolved by Saturday. Reports no pain this session.    Pertinent History arthritis, hypertension    Limitations Lifting    How long can you sit comfortably? unlimited    How long can you stand comfortably? unlimited    How long can you walk comfortably? 20 minutes    Patient Stated Goals get back to yard work and to be able to continue to drive as wife is unable    Currently in Pain? No/denies    Pain Onset 1 to 4 weeks ago    Multiple Pain Sites No                             [facility] Adult PT Treatment/Exercise - 04/11/20 0001      Lumbar Exercises: Stretches   Active Hamstring Stretch Right;Left;20 seconds    Active Hamstring Stretch Limitations seated    Single Knee to Chest Stretch Right;Left;1 rep;20 seconds    Lower Trunk Rotation 2 reps;10 seconds    Lower Trunk Rotation Limitations bil    Hip Flexor Stretch  Right;Left;3 reps;10 seconds    Hip Flexor Stretch Limitations 1/2 kneeling green foam    Other Lumbar Stretch Exercise seated ball roll into flexion x10      Lumbar Exercises: Seated   Sit to Stand Limitations 5#; 2x5 repetitions    Other Seated Lumbar Exercises seated on ball shoulder extension 25# x10; row 25# x10      Lumbar Exercises: Supine   Pelvic Tilt 10 reps      Lumbar Exercises: Sidelying   Other Sidelying Lumbar Exercises thoracic rotation 3x10s hold bil; blue foam roll      Modalities   Modalities Moist Heat      Moist Heat Therapy   Number Minutes Moist Heat 5 Minutes    Moist Heat Location Lumbar Spine      Manual Therapy   Manual Therapy Soft tissue mobilization    Soft tissue mobilization skilled palpation for assessment of tPR and increased tension within muscle; assessement of tissues response during DN            Trigger Point Dry Needling - 04/11/20 0001    Consent Given? Yes    Education Handout Provided Previously provided  Muscles Treated Back/Hip Lumbar multifidi;Thoracic multifidi    Other Dry Needling bil    Lumbar multifidi Response Palpable increased muscle length    Thoracic multifidi response Palpable increased muscle length                  PT Short Term Goals - 04/02/20 1417      PT SHORT TERM GOAL #1   Title Patient will be independent with HEP for continued progression at home.    Time 4    Period Weeks    Status New    Target Date 04/30/20      PT SHORT TERM GOAL #2   Title Patient will report no more than 3/10 pain at rest for improved functional activity tolerance    Time 4    Period Weeks    Status New    Target Date 04/30/20      PT SHORT TERM GOAL #3   Title Patient will demonstrate 50% lumbar extension ROM to more readily complete ADLs.    Time 4    Period Weeks    Status New    Target Date 04/30/20             PT Long Term Goals - 04/02/20 1422      PT LONG TERM GOAL #1   Title Patient will be  independent with advanced HEP for long term management of symptoms post D/C.    Time 8    Period Weeks    Status New    Target Date 05/28/20      PT LONG TERM GOAL #2   Title Patient will lift 10# from floor level using proper mechanics x5 reps without increased pain to more readily complete yardwork and homemaking duties.    Time 8    Period Weeks    Status New    Target Date 05/28/20      PT LONG TERM GOAL #3   Title Patient will improve FOTO score to 68 or higher to indicate improvd overall function.    Time 8    Period Weeks    Status New    Target Date 05/28/20      PT LONG TERM GOAL #4   Title Patient will roll Lt and Rt without increased pain for improved bed mobility.    Time 8    Period Weeks    Status New    Target Date 05/28/20                 Plan - 04/11/20 0946    Clinical Impression Statement Patient reports no pain this date. Continues to have some sorneness. Noting improved core activation with pelvic tilt exercise however patient requiring verbal cuing for decreased use of valsalva manuever. Requiring frequent cuing for scapular retraction to decreased thoracic kyphosis when performing seated ball exercises. Would benefit from continued skilled intervention to address impairments for improved activity tolerance and decreased pain. Will continue POC at decreased frequency.    Personal Factors and Comorbidities Age;Comorbidity 2    Comorbidities arthritis, hypertension    Examination-Activity Limitations Bed Mobility;Bend;Lift    Examination-Participation Restrictions Cleaning;Yard Work    Rehab Potential Good    PT Frequency 1x / week    PT Duration 8 weeks    PT Treatment/Interventions ADLs/Self Care Home Management;Cryotherapy;Electrical Stimulation;Iontophoresis 4mg /ml Dexamethasone;Moist Heat;Traction;Functional mobility training;Therapeutic activities;Therapeutic exercise;Neuromuscular re-education;Patient/family education;Manual techniques;Dry  needling;Passive range of motion    PT Next Visit Plan progress lumbar mobility and glute/core strength to  patient tolerance; functional training as able; DN as needed    PT Home Exercise Plan Access Code: [email or web address]    Consulted and Agree with Plan of Care Patient           Patient will benefit from skilled therapeutic intervention in order to improve the following deficits and impairments:  Decreased activity tolerance,Decreased mobility,Decreased range of motion,Decreased strength,Hypomobility,Increased muscle spasms,Postural dysfunction  Visit Diagnosis: Cramp and spasm  Chronic bilateral low back pain without sciatica     Problem List There are no problems to display for this patient.  Everardo All PT, DPT  04/11/20 10:17 AM     [facility] [address] [place], [place], [address] Phone: [phone]   Fax:  [phone]  Name: Dylan Pierce MRN: [id number] Date of Birth: 18-Feb-1940

## 2020-04-18 ENCOUNTER — Ambulatory Visit: Payer: Medicare Other

## 2020-04-18 ENCOUNTER — Other Ambulatory Visit: Payer: Self-pay

## 2020-04-18 DIAGNOSIS — R252 Cramp and spasm: Secondary | ICD-10-CM | POA: Diagnosis not present

## 2020-04-18 DIAGNOSIS — G8929 Other chronic pain: Secondary | ICD-10-CM | POA: Diagnosis not present

## 2020-04-18 DIAGNOSIS — M545 Low back pain, unspecified: Secondary | ICD-10-CM | POA: Diagnosis not present

## 2020-04-18 NOTE — Patient Instructions (Addendum)
Access Code: [facility] URL: [email or web address] Date: 04/18/2020 Prepared by: Laqueta Linden - 2 x daily - 7 x weekly - 2 sets - 10 reps Standing Shoulder Extension with Resistance - 1 x daily - 7 x weekly - 2 sets - 10 reps Standing Row with Anchored Resistance - 1 x daily - 7 x weekly - 2 sets - 10 reps Seated Figure 4 Piriformis Stretch - 1 x daily - 7 x weekly - 3 sets - 10 reps  Seated Transversus Abdominis Bracing - 1 x daily - 7 x weekly - 3 sets - 10 reps

## 2020-04-18 NOTE — Therapy (Signed)
[facility] [address], [place], [place], [address] Phone: [phone]   Fax:  [phone]  Physical Therapy Treatment  Patient Details  Name: Dylan Pierce MRN: [id number] Date of Birth: 11-30-1940 Referring Provider (PT): Duffy Rhody, MD   Encounter Date: 04/18/2020   PT End of Session - 04/18/20 1006    Visit Number 4    Date for PT Re-Evaluation 05/28/20    Authorization Type Medicare    Progress Note Due on Visit 10    PT Start Time 0922    PT Stop Time 1005    PT Time Calculation (min) 43 min    Activity Tolerance Patient tolerated treatment well    Behavior During Therapy [facility] for tasks assessed/performed           Past Medical History:  Diagnosis Date  . Arthritis   . Hypertension     Past Surgical History:  Procedure Laterality Date  . APPENDECTOMY      There were no vitals filed for this visit.   Subjective Assessment - 04/18/20 0918    Subjective I have been feeling good.  No pain today, the needling has helped me.  Max pain has been 2/10.    Currently in Pain? No/denies    Pain Score 2    up to 2/10 max this week   Pain Location Back    Pain Orientation Right;Mid;Lower    Pain Descriptors / Indicators Sore    Pain Type Acute pain    Pain Onset 1 to 4 weeks ago    Pain Frequency Intermittent    Aggravating Factors  activity    Pain Relieving Factors rest                             [facility] Adult PT Treatment/Exercise - 04/18/20 0001      Lumbar Exercises: Stretches   Active Hamstring Stretch Right;Left;20 seconds    Active Hamstring Stretch Limitations seated    Single Knee to Chest Stretch Right;Left;1 rep;20 seconds    Piriformis Stretch Right;Left;20 seconds;2 reps   seated figure 4     Lumbar Exercises: Aerobic   Nustep Level 2x 6 minutes -PT present to discuss progress      Lumbar Exercises: Standing   Row Strengthening;Both;20 reps;Theraband     Theraband Level (Row) Level 2 (Red)    Shoulder Extension Strengthening;Both;Theraband;20 reps    Theraband Level (Shoulder Extension) Level 2 (Red)      Lumbar Exercises: Seated   Sit to Stand Limitations 10#; 2x5 repetitions      Lumbar Exercises: Sidelying   Clam Both;20 reps                  PT Education - 04/18/20 0945    Education Details Access Code: [facility]    Person(s) Educated Patient    Methods Demonstration;Explanation    Comprehension Verbalized understanding;Returned demonstration            PT Short Term Goals - 04/02/20 1417      PT SHORT TERM GOAL #1   Title Patient will be independent with HEP for continued progression at home.    Time 4    Period Weeks    Status New    Target Date 04/30/20      PT SHORT TERM GOAL #2   Title Patient will report no more than 3/10 pain at rest for improved functional activity tolerance  Time 4    Period Weeks    Status New    Target Date 04/30/20      PT SHORT TERM GOAL #3   Title Patient will demonstrate 50% lumbar extension ROM to more readily complete ADLs.    Time 4    Period Weeks    Status New    Target Date 04/30/20             PT Long Term Goals - 04/02/20 1422      PT LONG TERM GOAL #1   Title Patient will be independent with advanced HEP for long term management of symptoms post D/C.    Time 8    Period Weeks    Status New    Target Date 05/28/20      PT LONG TERM GOAL #2   Title Patient will lift 10# from floor level using proper mechanics x5 reps without increased pain to more readily complete yardwork and homemaking duties.    Time 8    Period Weeks    Status New    Target Date 05/28/20      PT LONG TERM GOAL #3   Title Patient will improve FOTO score to 68 or higher to indicate improvd overall function.    Time 8    Period Weeks    Status New    Target Date 05/28/20      PT LONG TERM GOAL #4   Title Patient will roll Lt and Rt without increased pain for improved bed  mobility.    Time 8    Period Weeks    Status New    Target Date 05/28/20                 Plan - 04/18/20 0943    Clinical Impression Statement Pt arrived without pain today.  Pt reports that his max pain in the lumbar and thoracic spine was 2/10 max over the past week.  Pt is independent and compliant in HEP for flexibility.  PT added strength exercises to HEP including sidelying clam for lumbopelvic strength and scapular row and shoulder extension for thoracic stabilization.  PT provided tactile cues for alignment with clams and pt demonstrated gluteal fatigue with increased reps.  Pt will continue to benefit from skilled PT to address LBP and thoracic pain associated with a chronic condition.    PT Frequency 1x / week    PT Treatment/Interventions ADLs/Self Care Home Management;Cryotherapy;Electrical Stimulation;Iontophoresis 4mg /ml Dexamethasone;Moist Heat;Traction;Functional mobility training;Therapeutic activities;Therapeutic exercise;Neuromuscular re-education;Patient/family education;Manual techniques;Dry needling;Passive range of motion    PT Next Visit Plan progress lumbar mobility and glute/core strength to patient tolerance; functional training as able; DN as needed    PT Home Exercise Plan Access Code: [email or web address]    Consulted and Agree with Plan of Care Patient           Patient will benefit from skilled therapeutic intervention in order to improve the following deficits and impairments:  Decreased activity tolerance,Decreased mobility,Decreased range of motion,Decreased strength,Hypomobility,Increased muscle spasms,Postural dysfunction  Visit Diagnosis: Cramp and spasm  Chronic bilateral low back pain without sciatica     Problem List There are no problems to display for this patient.    Sigurd Sos, PT 04/18/20 10:07 AM  [facility] [address] [place], [place], [address] Phone: [phone]    Fax:  [phone]  Name: Dylan Pierce MRN: [id number] Date of Birth: 16-Nov-1940

## 2020-04-25 ENCOUNTER — Other Ambulatory Visit: Payer: Self-pay

## 2020-04-25 ENCOUNTER — Ambulatory Visit: Payer: Medicare Other

## 2020-04-25 DIAGNOSIS — R252 Cramp and spasm: Secondary | ICD-10-CM | POA: Diagnosis not present

## 2020-04-25 DIAGNOSIS — M545 Low back pain, unspecified: Secondary | ICD-10-CM | POA: Diagnosis not present

## 2020-04-25 DIAGNOSIS — G8929 Other chronic pain: Secondary | ICD-10-CM

## 2020-04-25 NOTE — Therapy (Addendum)
[facility] [address] [place], [place], [address] Phone: [phone]   Fax:  [phone]  Physical Therapy Treatment  Patient Details  Name: Dylan Pierce MRN: [id number] Date of Birth: 1940-04-21 Referring Provider (PT): Duffy Rhody, MD   Encounter Date: 04/25/2020   PT End of Session - 04/25/20 1017    Visit Number 5    Date for PT Re-Evaluation 05/28/20    Authorization Type Medicare    Progress Note Due on Visit 10    PT Start Time 0930    PT Stop Time 1015    PT Time Calculation (min) 45 min    Activity Tolerance Patient tolerated treatment well    Behavior During Therapy [facility] for tasks assessed/performed           Past Medical History:  Diagnosis Date  . Arthritis   . Hypertension     Past Surgical History:  Procedure Laterality Date  . APPENDECTOMY      There were no vitals filed for this visit.   Subjective Assessment - 04/25/20 0920    Subjective I have some aching in my low back.  Nothing like the pain I was having when I first started.  I think that I walked too far. I feel 90% better since the start of care.    Currently in Pain? Yes    Pain Score 1     Pain Location Back    Pain Orientation Lower;Left;Right    Pain Descriptors / Indicators Aching    Pain Type Chronic pain    Pain Onset 1 to 4 weeks ago    Pain Frequency Intermittent    Aggravating Factors  doing too much    Pain Relieving Factors rest, stretching                             [facility] Adult PT Treatment/Exercise - 04/25/20 0001      Lumbar Exercises: Stretches   Active Hamstring Stretch Right;Left;20 seconds    Active Hamstring Stretch Limitations seated    Single Knee to Chest Stretch Right;Left;1 rep;20 seconds    Lower Trunk Rotation 3 reps;20 seconds    Piriformis Stretch Right;Left;20 seconds;2 reps   seated figure 4   Other Lumbar Stretch Exercise seated ball roll into flexion  x10      Lumbar Exercises: Aerobic   Nustep Level 2x 8  minutes -PT present to discuss progress      Lumbar Exercises: Seated   Sit to Stand Limitations 10#; 2x10 repetitions      Lumbar Exercises: Supine   Ab Set 10 reps;3 seconds    AB Set Limitations with ball squeeze- verbal cues to reduce valsalva      Lumbar Exercises: Sidelying   Clam Both;20 reps                    PT Short Term Goals - 04/25/20 2376      PT SHORT TERM GOAL #1   Title Patient will be independent with HEP for continued progression at home.      PT SHORT TERM GOAL #2   Title Patient will report no more than 3/10 pain at rest for improved functional activity tolerance             PT Long Term Goals - 04/25/20 0959      PT LONG TERM GOAL #1   Title Patient will be  independent with advanced HEP for long term management of symptoms post D/C.    Status On-going      PT LONG TERM GOAL #2   Title Patient will lift 10# from floor level using proper mechanics x5 reps without increased pain to more readily complete yardwork and homemaking duties.    Status Achieved      PT LONG TERM GOAL #4   Title Patient will roll Lt and Rt without increased pain for improved bed mobility.    Status Achieved                 Plan - 04/25/20 1003    Clinical Impression Statement Pt arrived without pain today and reports some soreness at times in the lumbar spine that is related to new exercises. Pt reports 90% overall improvement in symptoms since the start of care. Pt is now able to roll in bed and lift 10# with good form and no pain, meeting LTGs for these activities.  PT provided tactile cues for alignment with clams and was able to tolerate more reps before fatigue.  Pt will continue to benefit from skilled PT to address LBP and thoracic pain associated with a chronic condition.    PT Frequency 1x / week    PT Duration 8 weeks    PT Treatment/Interventions ADLs/Self Care Home  Management;Cryotherapy;Electrical Stimulation;Iontophoresis 66m/ml Dexamethasone;Moist Heat;Traction;Functional mobility training;Therapeutic activities;Therapeutic exercise;Neuromuscular re-education;Patient/family education;Manual techniques;Dry needling;Passive range of motion    PT Next Visit Plan progress lumbar mobility and glute/core strength to patient tolerance; functional training as able; DN as needed    PT Home Exercise Plan Access Code: [email or web address]   Consulted and Agree with Plan of Care Patient           Patient will benefit from skilled therapeutic intervention in order to improve the following deficits and impairments:  Decreased activity tolerance,Decreased mobility,Decreased range of motion,Decreased strength,Hypomobility,Increased muscle spasms,Postural dysfunction  Visit Diagnosis: Cramp and spasm  Chronic bilateral low back pain without sciatica     Problem List There are no problems to display for this patient. KSigurd Sos PT 04/25/20 10:18 AM PHYSICAL THERAPY DISCHARGE SUMMARY  Visits from Start of Care: 5  Current functional level related to goals / functional outcomes: Pt called to cancel all appts due to feeling better and requested D/C to HEP.     Remaining deficits: See above for most current PT status.     Education / Equipment: HEP Plan: Patient agrees to discharge.  Patient goals were partially met. Patient is being discharged due to being pleased with the current functional level.  ?????        KSigurd Sos PT 04/29/20 3:43 PM   [facility] [address] [place] [address]: [phone]  Fax:  [phone] Name: Dylan DaveeMRN: [id number] of Birth: 4September 10, 1942

## 2020-04-30 ENCOUNTER — Encounter: Payer: Medicare Other | Admitting: Physical Therapy

## 2020-05-01 DIAGNOSIS — D485 Neoplasm of uncertain behavior of skin: Secondary | ICD-10-CM | POA: Diagnosis not present

## 2020-05-01 DIAGNOSIS — L820 Inflamed seborrheic keratosis: Secondary | ICD-10-CM | POA: Diagnosis not present

## 2020-05-02 ENCOUNTER — Encounter: Payer: Medicare Other | Admitting: Physical Therapy

## 2020-05-07 ENCOUNTER — Encounter: Payer: Medicare Other | Admitting: Physical Therapy

## 2020-05-09 ENCOUNTER — Encounter: Payer: Medicare Other | Admitting: Physical Therapy

## 2020-05-28 ENCOUNTER — Other Ambulatory Visit: Payer: Self-pay

## 2020-05-28 ENCOUNTER — Other Ambulatory Visit (HOSPITAL_BASED_OUTPATIENT_CLINIC_OR_DEPARTMENT_OTHER): Payer: Self-pay

## 2020-05-28 ENCOUNTER — Ambulatory Visit: Payer: Medicare Other | Attending: Internal Medicine

## 2020-05-28 DIAGNOSIS — Z23 Encounter for immunization: Secondary | ICD-10-CM

## 2020-05-28 MED ORDER — PFIZER-BIONT COVID-19 VAC-TRIS 30 MCG/0.3ML IM SUSP
INTRAMUSCULAR | 0 refills | Status: DC
  Filled 2020-05-28: qty 0.3, 1d supply, fill #0

## 2020-05-28 NOTE — Progress Notes (Signed)
   Covid-19 Vaccination Clinic  Name:  Dylan Pierce    MRN: [id number] DOB: 1940/09/25  05/28/2020  Mr. Goldsborough was observed post Covid-19 immunization for 15 minutes without incident. He was provided with Vaccine Information Sheet and instruction to access the V-Safe system.   Mr. Dunshee was instructed to call 911 with any severe reactions post vaccine: Marland Kitchen Difficulty breathing  . Swelling of face and throat  . A fast heartbeat  . A bad rash all over body  . Dizziness and weakness   Immunizations Administered    Name Date Dose VIS Date Route   PFIZER Comrnaty(Gray TOP) Covid-19 Vaccine 05/28/2020 12:57 PM 0.3 mL 12/14/2019 Intramuscular   Manufacturer: [place]   Lot: [id number]   [facility]: [phone]

## 2020-08-09 DIAGNOSIS — G4700 Insomnia, unspecified: Secondary | ICD-10-CM | POA: Diagnosis not present

## 2020-08-09 DIAGNOSIS — I10 Essential (primary) hypertension: Secondary | ICD-10-CM | POA: Diagnosis not present

## 2020-08-09 DIAGNOSIS — M545 Low back pain, unspecified: Secondary | ICD-10-CM | POA: Diagnosis not present

## 2020-08-09 DIAGNOSIS — L719 Rosacea, unspecified: Secondary | ICD-10-CM | POA: Diagnosis not present

## 2020-09-11 DIAGNOSIS — H401313 Pigmentary glaucoma, right eye, severe stage: Secondary | ICD-10-CM | POA: Diagnosis not present

## 2020-09-11 DIAGNOSIS — H35372 Puckering of macula, left eye: Secondary | ICD-10-CM | POA: Diagnosis not present

## 2020-09-11 DIAGNOSIS — H401321 Pigmentary glaucoma, left eye, mild stage: Secondary | ICD-10-CM | POA: Diagnosis not present

## 2020-09-11 DIAGNOSIS — H353112 Nonexudative age-related macular degeneration, right eye, intermediate dry stage: Secondary | ICD-10-CM | POA: Diagnosis not present

## 2020-10-23 ENCOUNTER — Ambulatory Visit: Payer: Medicare Other | Attending: Internal Medicine

## 2020-10-23 ENCOUNTER — Other Ambulatory Visit (HOSPITAL_BASED_OUTPATIENT_CLINIC_OR_DEPARTMENT_OTHER): Payer: Self-pay

## 2020-10-23 ENCOUNTER — Other Ambulatory Visit: Payer: Self-pay

## 2020-10-23 DIAGNOSIS — Z23 Encounter for immunization: Secondary | ICD-10-CM

## 2020-10-23 MED ORDER — PFIZER COVID-19 VAC BIVALENT 30 MCG/0.3ML IM SUSP
INTRAMUSCULAR | 0 refills | Status: DC
  Filled 2020-10-23: qty 0.3, 1d supply, fill #0

## 2020-10-23 NOTE — Progress Notes (Signed)
   Covid-19 Vaccination Clinic  Name:  Chatham Howington    MRN: [id number] DOB: 16-Feb-1940  10/23/2020  Mr. Esau was observed post Covid-19 immunization for 15 minutes without incident. He was provided with Vaccine Information Sheet and instruction to access the V-Safe system.   Mr. Hemp was instructed to call 911 with any severe reactions post vaccine: Difficulty breathing  Swelling of face and throat  A fast heartbeat  A bad rash all over body  Dizziness and weakness   Immunizations Administered     Name Date Dose VIS Date Route   Pfizer Covid-19 Vaccine Bivalent Booster 10/23/2020  3:04 PM 0.3 mL 09/04/2020 Intramuscular   Manufacturer: [place]   Lot: [id number]   [place]: [phone]

## 2021-02-14 DIAGNOSIS — L719 Rosacea, unspecified: Secondary | ICD-10-CM | POA: Diagnosis not present

## 2021-02-14 DIAGNOSIS — G4700 Insomnia, unspecified: Secondary | ICD-10-CM | POA: Diagnosis not present

## 2021-02-14 DIAGNOSIS — Z0000 Encounter for general adult medical examination without abnormal findings: Secondary | ICD-10-CM | POA: Diagnosis not present

## 2021-02-14 DIAGNOSIS — I10 Essential (primary) hypertension: Secondary | ICD-10-CM | POA: Diagnosis not present

## 2021-03-19 DIAGNOSIS — H401313 Pigmentary glaucoma, right eye, severe stage: Secondary | ICD-10-CM | POA: Diagnosis not present

## 2021-03-19 DIAGNOSIS — H401321 Pigmentary glaucoma, left eye, mild stage: Secondary | ICD-10-CM | POA: Diagnosis not present

## 2021-03-19 DIAGNOSIS — H04123 Dry eye syndrome of bilateral lacrimal glands: Secondary | ICD-10-CM | POA: Diagnosis not present

## 2021-08-25 DIAGNOSIS — L719 Rosacea, unspecified: Secondary | ICD-10-CM | POA: Diagnosis not present

## 2021-08-25 DIAGNOSIS — M545 Low back pain, unspecified: Secondary | ICD-10-CM | POA: Diagnosis not present

## 2021-08-25 DIAGNOSIS — G4700 Insomnia, unspecified: Secondary | ICD-10-CM | POA: Diagnosis not present

## 2021-08-25 DIAGNOSIS — I10 Essential (primary) hypertension: Secondary | ICD-10-CM | POA: Diagnosis not present

## 2021-08-25 DIAGNOSIS — D696 Thrombocytopenia, unspecified: Secondary | ICD-10-CM | POA: Diagnosis not present

## 2021-10-31 ENCOUNTER — Other Ambulatory Visit (HOSPITAL_BASED_OUTPATIENT_CLINIC_OR_DEPARTMENT_OTHER): Payer: Self-pay

## 2021-10-31 MED ORDER — INFLUENZA VAC A&B SA ADJ QUAD 0.5 ML IM PRSY
PREFILLED_SYRINGE | INTRAMUSCULAR | 0 refills | Status: DC
  Filled 2021-10-31: qty 0.5, 1d supply, fill #0

## 2021-10-31 MED ORDER — COMIRNATY 30 MCG/0.3ML IM SUSY
PREFILLED_SYRINGE | INTRAMUSCULAR | 0 refills | Status: DC
  Filled 2021-10-31: qty 0.3, 1d supply, fill #0

## 2022-01-27 DIAGNOSIS — H52203 Unspecified astigmatism, bilateral: Secondary | ICD-10-CM | POA: Diagnosis not present

## 2022-01-27 DIAGNOSIS — H35372 Puckering of macula, left eye: Secondary | ICD-10-CM | POA: Diagnosis not present

## 2022-01-27 DIAGNOSIS — H401313 Pigmentary glaucoma, right eye, severe stage: Secondary | ICD-10-CM | POA: Diagnosis not present

## 2022-01-27 DIAGNOSIS — H4321 Crystalline deposits in vitreous body, right eye: Secondary | ICD-10-CM | POA: Diagnosis not present

## 2022-01-27 DIAGNOSIS — H524 Presbyopia: Secondary | ICD-10-CM | POA: Diagnosis not present

## 2022-03-19 DIAGNOSIS — K080 Exfoliation of teeth due to systemic causes: Secondary | ICD-10-CM | POA: Diagnosis not present

## 2022-03-24 DIAGNOSIS — N1831 Chronic kidney disease, stage 3a: Secondary | ICD-10-CM | POA: Diagnosis not present

## 2022-03-24 DIAGNOSIS — L719 Rosacea, unspecified: Secondary | ICD-10-CM | POA: Diagnosis not present

## 2022-03-24 DIAGNOSIS — I160 Hypertensive urgency: Secondary | ICD-10-CM | POA: Diagnosis not present

## 2022-03-24 DIAGNOSIS — M545 Low back pain, unspecified: Secondary | ICD-10-CM | POA: Diagnosis not present

## 2022-03-24 DIAGNOSIS — D696 Thrombocytopenia, unspecified: Secondary | ICD-10-CM | POA: Diagnosis not present

## 2022-03-24 DIAGNOSIS — I10 Essential (primary) hypertension: Secondary | ICD-10-CM | POA: Diagnosis not present

## 2022-03-24 DIAGNOSIS — Z23 Encounter for immunization: Secondary | ICD-10-CM | POA: Diagnosis not present

## 2022-03-24 DIAGNOSIS — G4700 Insomnia, unspecified: Secondary | ICD-10-CM | POA: Diagnosis not present

## 2022-03-24 DIAGNOSIS — Z1331 Encounter for screening for depression: Secondary | ICD-10-CM | POA: Diagnosis not present

## 2022-03-24 DIAGNOSIS — Z0000 Encounter for general adult medical examination without abnormal findings: Secondary | ICD-10-CM | POA: Diagnosis not present

## 2022-03-27 ENCOUNTER — Encounter (HOSPITAL_BASED_OUTPATIENT_CLINIC_OR_DEPARTMENT_OTHER): Payer: Self-pay

## 2022-03-27 DIAGNOSIS — N1831 Chronic kidney disease, stage 3a: Secondary | ICD-10-CM

## 2022-03-27 DIAGNOSIS — I160 Hypertensive urgency: Secondary | ICD-10-CM

## 2022-03-27 DIAGNOSIS — Z1331 Encounter for screening for depression: Secondary | ICD-10-CM | POA: Insufficient documentation

## 2022-03-27 DIAGNOSIS — J309 Allergic rhinitis, unspecified: Secondary | ICD-10-CM | POA: Insufficient documentation

## 2022-03-27 DIAGNOSIS — K219 Gastro-esophageal reflux disease without esophagitis: Secondary | ICD-10-CM | POA: Insufficient documentation

## 2022-03-27 DIAGNOSIS — I10 Essential (primary) hypertension: Secondary | ICD-10-CM | POA: Insufficient documentation

## 2022-03-27 DIAGNOSIS — G4700 Insomnia, unspecified: Secondary | ICD-10-CM | POA: Insufficient documentation

## 2022-03-27 DIAGNOSIS — M545 Low back pain, unspecified: Secondary | ICD-10-CM

## 2022-03-27 DIAGNOSIS — L719 Rosacea, unspecified: Secondary | ICD-10-CM

## 2022-03-27 DIAGNOSIS — D696 Thrombocytopenia, unspecified: Secondary | ICD-10-CM | POA: Insufficient documentation

## 2022-03-31 NOTE — Progress Notes (Unsigned)
Cardiology Office Note:    Date:  04/02/2022   ID:  C[place]ley [place], [place] [birth year], MRN [id number]  PCP:  Antony Contras, MD  Cardiologist:  None  Electrophysiologist:  None   Referring MD: Antony Contras, MD   Chief Complaint  Patient presents with   Hypertension    History of Present Illness:    Dylan Pierce is a 82 y.o. male with a hx of hypertension who is referred by Dr. Moreen Fowler for evaluation of hypertension.  BP elevated over 200 when checked recently at Dr. Laqueta Linden office.  Denies any chest pain, dyspnea, lightheadedness, syncope, lower extremity edema, or palpitations.  Ran 5K on Saturday, no exertional symptoms.  No smoking history.  No history of heart disease in his immediate family.  Reports when he checks BP at home has been 140s over 70s.   Past Medical History:  Diagnosis Date   Arthritis    Chronic neck and back pain    Esophageal reflux    Glaucoma    Hypertension    Kidney stones    Rosacea    Shingles    Spinal stenosis    T9 vertebral fracture (HCC)     Past Surgical History:  Procedure Laterality Date   APPENDECTOMY     COLONOSCOPY     ESOPHAGOGASTRODUODENOSCOPY      Current Medications: Current Meds  Medication Sig   amitriptyline (ELAVIL) 10 MG tablet Take 10 mg by mouth at bedtime.   esomeprazole (NEXIUM) 20 MG capsule Take 20 mg by mouth daily as needed.   latanoprost (XALATAN) 0.005 % ophthalmic solution 1 drop at bedtime.   metroNIDAZOLE (METROGEL) 1 % gel Apply topically daily.   Multiple Vitamins-Minerals (PRESERVISION AREDS 2) CHEW Chew 1 tablet by mouth 2 (two) times daily.   naproxen sodium (ALEVE) 220 MG tablet Take 220 mg by mouth.   telmisartan (MICARDIS) 80 MG tablet Take 80 mg by mouth daily.   timolol (TIMOPTIC) 0.5 % ophthalmic solution 1 drop 2 (two) times daily.   traMADol (ULTRAM) 50 MG tablet Take by mouth every 6 (six) hours as needed.   triamterene-hydrochlorothiazide (DYAZIDE) 37.5-25 MG capsule Take 1  capsule by mouth daily.   [DISCONTINUED] amLODipine (NORVASC) 2.5 MG tablet Take 2.5 mg by mouth daily.     Allergies:   Amlodipine besylate, Cortisone, Lisinopril, Penicillin g, and Sulfa antibiotics   Social History   Socioeconomic History   Marital status: Married    Spouse name: Not on file   Number of children: Not on file   Years of education: Not on file   Highest education level: Not on file  Occupational History   Not on file  Tobacco Use   Smoking status: Never   Smokeless tobacco: Never  Substance and Sexual Activity   Alcohol use: Not Currently   Drug use: Not on file   Sexual activity: Not on file  Other Topics Concern   Not on file  Social History Narrative   Not on file   Social Determinants of Health   Financial Resource Strain: Not on file  Food Insecurity: Not on file  Transportation Needs: Not on file  Physical Activity: Not on file  Stress: Not on file  Social Connections: Not on file     Family History: The patient's family history includes Alcoholism in his father; Cirrhosis in his mother; Colon cancer in his brother; Diabetes in his brother, maternal grandmother, and mother; Heart attack in his paternal grandfather; Hypertension in his brother,  brother, brother, maternal grandfather, maternal grandmother, and paternal grandfather; Melanoma in his brother; Stroke in his maternal grandfather and maternal grandmother.  ROS:   Please see the history of present illness.     All other systems reviewed and are negative.  EKGs/Labs/Other Studies Reviewed:    The following studies were reviewed today:   EKG:   03/25/2022: Normal sinus rhythm, rate 62, no ST abnormality   Recent Labs: No results found for requested labs within last 365 days.  Recent Lipid Panel No results found for: "CHOL", "TRIG", "HDL", "CHOLHDL", "VLDL", "LDLCALC", "LDLDIRECT"  Physical Exam:    VS:  BP (!) 198/95   Pulse 62   Ht 5' 10.5" (1.791 m)   Wt 186 lb (84.4 kg)    SpO2 98%   BMI 26.31 kg/m     Wt Readings from Last 3 Encounters:  04/02/22 186 lb (84.4 kg)  03/24/22 191 lb 12.8 oz (87 kg)     GEN:  Well nourished, well developed in no acute distress HEENT: Normal NECK: No JVD LYMPHATICS: No lymphadenopathy CARDIAC: RRR, no murmurs, rubs, gallops RESPIRATORY:  Clear to auscultation without rales, wheezing or rhonchi  ABDOMEN: Soft, non-tender, non-distended MUSCULOSKELETAL:  No edema; No deformity  SKIN: Warm and dry NEUROLOGIC:  Alert and oriented x 3 PSYCHIATRIC:  Normal affect   ASSESSMENT:    1. Essential hypertension    PLAN:    Hypertension: On telmisartan 80 mg daily, amlodipine 2.5 mg daily, triamterene-HCTZ 37.5-25 mg daily.  BP significantly elevated in clinic today but reports under much better control when checks at home.  Appears asymptomatic.  Will start with increasing amlodipine to 5 mg daily.  Asked to check BP twice daily for next week and bring log and home BP monitor to calibrate to appointment with pharmacy hypertension clinic in 2 weeks.  Will check echocardiogram  RTC in 3 months   Medication Adjustments/Labs and Tests Ordered: Current medicines are reviewed at length with the patient today.  Concerns regarding medicines are outlined above.  Orders Placed This Encounter  Procedures   AMB Referral to [facility]   EKG 12-Lead   ECHOCARDIOGRAM COMPLETE   Meds ordered this encounter  Medications   amLODipine (NORVASC) 5 MG tablet    Sig: Take 1 tablet (5 mg total) by mouth daily.    Dispense:  90 tablet    Refill:  3    Dose increase    Patient Instructions  Medication Instructions:  INCREASE amlodipine to 5 mg daily  *If you need a refill on your cardiac medications before your next appointment, please call your pharmacy*  Testing/Procedures: Your physician has requested that you have an echocardiogram. Echocardiography is a painless test that uses sound waves to create images of your heart. It  provides your doctor with information about the size and shape of your heart and how well your heart's chambers and valves are working. This procedure takes approximately one hour. There are no restrictions for this procedure. Please do NOT wear cologne, perfume, aftershave, or lotions (deodorant is allowed). Please arrive 15 minutes prior to your appointment time.  Follow-Up: At [facility], you and your health needs are our priority.  As part of our continuing mission to provide you with exceptional heart care, we have created designated Provider Care Teams.  These Care Teams include your primary Cardiologist (physician) and Advanced Practice Providers (APPs -  Physician Assistants and Nurse Practitioners) who all work together to provide you with the care  you need, when you need it.  We recommend signing up for the patient portal called "MyChart".  Sign up information is provided on this After Visit Summary.  MyChart is used to connect with patients for Virtual Visits (Telemedicine).  Patients are able to view lab/test results, encounter notes, upcoming appointments, etc.  Non-urgent messages can be sent to your provider as well.   To learn more about what you can do with MyChart, go to [email or web address].    Your next appointment:   Pharmacist in 2 weeks (blood pressure follow up) --bring home blood pressure cuff and blood pressure log  3 months with Dr. Gardiner Rhyme   Please check your blood pressure at home twice daily, write it down.  Bring log to appointment with the pharmacist.   Signed, Donato Heinz, MD  04/02/2022 5:47 PM    [place]

## 2022-04-02 ENCOUNTER — Ambulatory Visit (HOSPITAL_BASED_OUTPATIENT_CLINIC_OR_DEPARTMENT_OTHER): Payer: Medicare Other | Admitting: Cardiology

## 2022-04-02 ENCOUNTER — Encounter (HOSPITAL_BASED_OUTPATIENT_CLINIC_OR_DEPARTMENT_OTHER): Payer: Self-pay | Admitting: Cardiology

## 2022-04-02 VITALS — BP 198/95 | HR 62 | Ht 70.5 in | Wt 186.0 lb

## 2022-04-02 DIAGNOSIS — I10 Essential (primary) hypertension: Secondary | ICD-10-CM

## 2022-04-02 MED ORDER — AMLODIPINE BESYLATE 5 MG PO TABS: 5.0000 mg | ORAL_TABLET | Freq: Every day | ORAL | 3 refills | Status: AC

## 2022-04-02 NOTE — Patient Instructions (Signed)
Medication Instructions:  INCREASE amlodipine to 5 mg daily  *If you need a refill on your cardiac medications before your next appointment, please call your pharmacy*  Testing/Procedures: Your physician has requested that you have an echocardiogram. Echocardiography is a painless test that uses sound waves to create images of your heart. It provides your doctor with information about the size and shape of your heart and how well your heart's chambers and valves are working. This procedure takes approximately one hour. There are no restrictions for this procedure. Please do NOT wear cologne, perfume, aftershave, or lotions (deodorant is allowed). Please arrive 15 minutes prior to your appointment time.  Follow-Up: At [facility], you and your health needs are our priority.  As part of our continuing mission to provide you with exceptional heart care, we have created designated Provider Care Teams.  These Care Teams include your primary Cardiologist (physician) and Advanced Practice Providers (APPs -  Physician Assistants and Nurse Practitioners) who all work together to provide you with the care you need, when you need it.  We recommend signing up for the patient portal called "MyChart".  Sign up information is provided on this After Visit Summary.  MyChart is used to connect with patients for Virtual Visits (Telemedicine).  Patients are able to view lab/test results, encounter notes, upcoming appointments, etc.  Non-urgent messages can be sent to your provider as well.   To learn more about what you can do with MyChart, go to [email or web address].    Your next appointment:   Pharmacist in 2 weeks (blood pressure follow up) --bring home blood pressure cuff and blood pressure log  3 months with Dr. Gardiner Rhyme   Please check your blood pressure at home twice daily, write it down.  Bring log to appointment with the pharmacist.

## 2022-04-27 ENCOUNTER — Ambulatory Visit (INDEPENDENT_AMBULATORY_CARE_PROVIDER_SITE_OTHER): Payer: Medicare Other

## 2022-04-27 DIAGNOSIS — I10 Essential (primary) hypertension: Secondary | ICD-10-CM

## 2022-04-27 LAB — ECHOCARDIOGRAM COMPLETE
Area-P 1/2: 2.48 cm2
MV M vel: 2.37 m/s
MV Peak grad: 22.5 mmHg
S' Lateral: 2.86 cm

## 2022-05-14 ENCOUNTER — Ambulatory Visit: Payer: Medicare Other | Attending: Cardiology | Admitting: Student

## 2022-05-14 VITALS — BP 161/69 | HR 57

## 2022-05-14 DIAGNOSIS — I10 Essential (primary) hypertension: Secondary | ICD-10-CM

## 2022-05-14 MED ORDER — TRIAMTERENE-HCTZ 75-50 MG PO TABS: 1.0000 | ORAL_TABLET | Freq: Every day | ORAL | 3 refills | Status: DC

## 2022-05-14 NOTE — Assessment & Plan Note (Signed)
Assessment: BP is uncontrolled in office BP 167/82 2nd reading 161/69 heart rate 57(goal <130/80) Home BP ~ 145/77 range with some readings 126/76 Home cuff validated - found out to be accurate   Tolerates BP medications well without any side effects Denies SOB, palpitation, chest pain, headaches,or swelling CrCl 60 ml/min (1.12 SrCr 03/19/2022 [facility]) K : 5.2 03/19/2022 [facility] Renal function >50 ml/min optimize diuretics combination to achieve optimal BP   Plan:  Increase dose for triamterene - HCTZ 75/50 once daily  Continue taking telmisartan 80 mg daily and amlodipine 5 mg daily Cut down on potassium containing food - orange juice, cantaloupe, banana   Patient to keep record of BP readings with heart rate and report to [place] at the next visit Patient to see PharmD in 5 weeks for follow up  Follow up lab(s): BMP in 7-10 days

## 2022-05-14 NOTE — Progress Notes (Signed)
Patient ID: Dylan Pierce                 DOB: [birth year]                      MRN: [id number]      HPI: Dylan Pierce is a 82 y.o. male referred by Dr.Schumann to HTN clinic. PMH is significant for HTN  At OV with Dr.Schumann amlodipine dose was increased from 2.5 mg daily to 5 mg daily. And patient was advised to bring home BP log and BP monitor for calibration at today's visit.   Patient presented today with BP monitor and home readings. Home reading ~ 145/77 range with some readings 126/76.  Home cuff validated in the office it is reading within 10 points.He is very active and walks 30 min every day do starches before walks plus twice week join daughter at gym for 45 min cardio+ resistance. Takes BP medications regularly and tolerates them well. After his wife's heart attack they had made significant changes to their diet. Eats low salt low fat home cooked meals.    Current HTN meds: telmisartan 80 mg daily, amlodipine 5 mg daily, triamterene-HCTZ 37.5-25 mg daily  Previously tried: lisinopril - cough BP goal: <130/80 CrCl:60 mL/min 1st on home BP monitor -168/88 heart rate  2nd on home BP monitor- 167/82 heart rate 57  1st on office cuff 167/68 heart rate 57 3rd on home BP monitor - 161/81 heart rate 59 2nd on office cuff -161/69 heart rate 57  Family History:  Father- HTN Mother: diabetes  Meternal GM- heart disease Maternal GF: stroke  Brothers- died from cancer  Social History:  Alcohol: none Smoking: never   Diet: low salt, low fat diet Cook meals at home, cut down on sweet and salty snacks  Lots of vegetables, chicken, fish- baked and grilled   Exercise: walk/jogging  30 min every day, starches before walking + twice a week gym 45 min (cardio+resistance)   Home BP readings: ~ 145/77 range with some readings 126/76  Wt Readings from Last 3 Encounters:  04/02/22 186 lb (84.4 kg)  03/24/22 191 lb 12.8 oz (87 kg)   BP Readings from Last 3 Encounters:   05/14/22 (!) 161/69  04/02/22 (!) 198/95  03/24/22 (!) 224/95   Pulse Readings from Last 3 Encounters:  05/14/22 (!) 57  04/02/22 62  03/24/22 (!) 53    Renal function: CrCl cannot be calculated (No successful lab value found.).  Past Medical History:  Diagnosis Date   Arthritis    Chronic neck and back pain    Esophageal reflux    Glaucoma    Hypertension    Kidney stones    Rosacea    Shingles    Spinal stenosis    T9 vertebral fracture (HCC)     Current Outpatient Medications on File Prior to Visit  Medication Sig Dispense Refill   amitriptyline (ELAVIL) 10 MG tablet Take 10 mg by mouth at bedtime.     amLODipine (NORVASC) 5 MG tablet Take 1 tablet (5 mg total) by mouth daily. 90 tablet 3   esomeprazole (NEXIUM) 20 MG capsule Take 20 mg by mouth daily as needed.     latanoprost (XALATAN) 0.005 % ophthalmic solution 1 drop at bedtime.     metroNIDAZOLE (METROGEL) 1 % gel Apply topically daily.     Multiple Vitamins-Minerals (PRESERVISION AREDS 2) CHEW Chew 1 tablet by mouth 2 (two) times daily.  naproxen sodium (ALEVE) 220 MG tablet Take 220 mg by mouth.     telmisartan (MICARDIS) 80 MG tablet Take 80 mg by mouth daily.     timolol (TIMOPTIC) 0.5 % ophthalmic solution 1 drop 2 (two) times daily.     traMADol (ULTRAM) 50 MG tablet Take by mouth every 6 (six) hours as needed.     No current facility-administered medications on file prior to visit.    Allergies  Allergen Reactions   Amlodipine Besylate     Other Reaction(s): tolerates 2.5 mg, but 5 mg led to swelling   Cortisone     Other Reaction(s): joints swells   Lisinopril Cough   Penicillin G     Other Reaction(s): rash, swelling tongue   Sulfa Antibiotics     Other Reaction(s): rash, swelling tongu    Blood pressure (!) 161/69, pulse (!) 57, SpO2 98 %.   Assessment/Plan:  1. Hypertension -  Essential hypertension Assessment: BP is uncontrolled in office BP 167/82 2nd reading 161/69 heart rate  57(goal <130/80) Home BP ~ 145/77 range with some readings 126/76 Home cuff validated - found out to be accurate   Tolerates BP medications well without any side effects Denies SOB, palpitation, chest pain, headaches,or swelling CrCl 60 ml/min (1.12 SrCr 03/19/2022 [facility]) K : 5.2 03/19/2022 [facility] Renal function >50 ml/min optimize diuretics combination to achieve optimal BP   Plan:  Increase dose for triamterene - HCTZ 75/50 once daily  Continue taking telmisartan 80 mg daily and amlodipine 5 mg daily Cut down on potassium containing food - orange juice, cantaloupe, banana   Patient to keep record of BP readings with heart rate and report to [place] at the next visit Patient to see PharmD in 5 weeks for follow up  Follow up lab(s): BMP in 7-10 days       Thank you  Carmela Hurt, Pharm.D [facility] A Division of [facility] [address], [place], [place] [address]  Phone: [phone]; Fax: [phone]

## 2022-05-14 NOTE — Patient Instructions (Addendum)
Changes made by your pharmacist Carmela Hurt, PharmD at today's visit:    Instructions/Changes  (what do you need to do) Your Notes  (what you did and when you did it)  Increase triamterene-HCTZ 75/50 mg daily    2. Continue taking telmisartan 80 mg daily, amlodipine 5 mg daily    Bring all of your meds, your BP cuff and your record of home blood pressures to your next appointment.    HOW TO TAKE YOUR BLOOD PRESSURE AT HOME  Rest 5 minutes before taking your blood pressure.  Don't smoke or drink caffeinated beverages for at least 30 minutes before. Take your blood pressure before (not after) you eat. Sit comfortably with your back supported and both feet on the floor (don't cross your legs). Elevate your arm to heart level on a table or a desk. Use the proper sized cuff. It should fit smoothly and snugly around your bare upper arm. There should be enough room to slip a fingertip under the cuff. The bottom edge of the cuff should be 1 inch above the crease of the elbow. Ideally, take 3 measurements at one sitting and record the average.  Important lifestyle changes to control high blood pressure  Intervention  Effect on the BP  Lose extra pounds and watch your waistline Weight loss is one of the most effective lifestyle changes for controlling blood pressure. If you're overweight or obese, losing even a small amount of weight can help reduce blood pressure. Blood pressure might go down by about 1 millimeter of mercury (mm Hg) with each kilogram (about 2.2 pounds) of weight lost.  Exercise regularly As a general goal, aim for at least 30 minutes of moderate physical activity every day. Regular physical activity can lower high blood pressure by about 5 to 8 mm Hg.  Eat a healthy diet Eating a diet rich in whole grains, fruits, vegetables, and low-fat dairy products and low in saturated fat and cholesterol. A healthy diet can lower high blood pressure by up to 11 mm Hg.  Reduce salt  (sodium) in your diet Even a small reduction of sodium in the diet can improve heart health and reduce high blood pressure by about 5 to 6 mm Hg.  Limit alcohol One drink equals 12 ounces of beer, 5 ounces of wine, or 1.5 ounces of 80-proof liquor.  Limiting alcohol to less than one drink a day for women or two drinks a day for men can help lower blood pressure by about 4 mm Hg.   If you have any questions or concerns please use My Chart to send questions or call the office at [phone]

## 2022-05-22 DIAGNOSIS — I10 Essential (primary) hypertension: Secondary | ICD-10-CM | POA: Diagnosis not present

## 2022-05-23 LAB — BASIC METABOLIC PANEL
BUN/Creatinine Ratio: 15 (ref 10–24)
BUN: 23 mg/dL (ref 8–27)
CO2: 25 mmol/L (ref 20–29)
Calcium: 10.2 mg/dL (ref 8.6–10.2)
Chloride: 101 mmol/L (ref 96–106)
Creatinine, Ser: 1.54 mg/dL — ABNORMAL HIGH (ref 0.76–1.27)
Glucose: 84 mg/dL (ref 70–99)
Potassium: 5.5 mmol/L — ABNORMAL HIGH (ref 3.5–5.2)
Sodium: 137 mmol/L (ref 134–144)
eGFR: 44 mL/min/{1.73_m2} — ABNORMAL LOW (ref >59)

## 2022-05-25 ENCOUNTER — Telehealth: Payer: Self-pay | Admitting: Pharmacist

## 2022-05-25 ENCOUNTER — Other Ambulatory Visit: Payer: Self-pay | Admitting: Pharmacist

## 2022-05-25 DIAGNOSIS — I10 Essential (primary) hypertension: Secondary | ICD-10-CM

## 2022-05-25 MED ORDER — HYDROCHLOROTHIAZIDE 25 MG PO TABS: 25.0000 mg | ORAL_TABLET | Freq: Every day | ORAL | 3 refills | Status: AC

## 2022-05-25 NOTE — Telephone Encounter (Signed)
Due to elevation in SrCr and K level therapy changed  D/C Triamterene - HCTZ  initiate HCTZ 25 mg daily   Follow BMP due Friday May 24

## 2022-05-26 DIAGNOSIS — H524 Presbyopia: Secondary | ICD-10-CM | POA: Diagnosis not present

## 2022-06-03 DIAGNOSIS — I10 Essential (primary) hypertension: Secondary | ICD-10-CM | POA: Diagnosis not present

## 2022-06-03 LAB — BASIC METABOLIC PANEL
Chloride: 102 mmol/L (ref 96–106)
Potassium: 5.1 mmol/L (ref 3.5–5.2)

## 2022-06-04 LAB — BASIC METABOLIC PANEL
BUN/Creatinine Ratio: 14 (ref 10–24)
BUN: 21 mg/dL (ref 8–27)
Calcium: 9.9 mg/dL (ref 8.6–10.2)
Creatinine, Ser: 1.5 mg/dL — ABNORMAL HIGH (ref 0.76–1.27)
Glucose: 87 mg/dL (ref 70–99)
Sodium: 141 mmol/L (ref 134–144)
eGFR: 46 mL/min/{1.73_m2} — ABNORMAL LOW (ref >59)

## 2022-06-05 ENCOUNTER — Encounter: Payer: Self-pay | Admitting: *Deleted

## 2022-06-18 ENCOUNTER — Encounter: Payer: Self-pay | Admitting: Pharmacist Clinician (PhC)/ Clinical Pharmacy Specialist

## 2022-06-18 ENCOUNTER — Ambulatory Visit
Payer: Medicare Other | Attending: Internal Medicine | Admitting: Pharmacist Clinician (PhC)/ Clinical Pharmacy Specialist

## 2022-06-18 VITALS — BP 146/79 | HR 56

## 2022-06-18 DIAGNOSIS — I10 Essential (primary) hypertension: Secondary | ICD-10-CM | POA: Diagnosis not present

## 2022-06-18 NOTE — Assessment & Plan Note (Signed)
Assessment: BP is uncontrolled in office BP 146/79 mmHg;  above the goal (<130/80). Home readings, on validated Omron cuff average 132/69 Tolerates current medications well without any side effects Denies SOB, palpitation, chest pain, headaches,or swelling Praised exercise regimen, including ability to run 5K races   Plan:  Continue taking telmisartan 80 mg, amlodipine 5 mg and hctz 25 mg - move amlodipine to evenings for balance Patient to monitor BP readings with heart rate and report to [place] should they become elevated Patient to follow up with Dr. Bjorn Pippin in July  Labs ordered today:  none

## 2022-06-18 NOTE — Patient Instructions (Signed)
Follow up appointment: with Dr. Bjorn Pippin in July  Take your BP meds as follows: no changes to your medications today.  Check your blood pressure at home daily (if able) and keep record of the readings.  Hypertension "High blood pressure"  Hypertension is often called "The Silent Killer." It rarely causes symptoms until it is extremely  high or has done damage to other organs in the body. For this reason, you should have your  blood pressure checked regularly by your physician. We will check your blood pressure  every time you see a provider at one of our offices.   Your blood pressure reading consists of two numbers. Ideally, blood pressure should be  below 120/80. The first ("top") number is called the systolic pressure. It measures the  pressure in your arteries as your heart beats. The second ("bottom") number is called the diastolic pressure. It measures the pressure in your arteries as the heart relaxes between beats.  The benefits of getting your blood pressure under control are enormous. A 10-point  reduction in systolic blood pressure can reduce your risk of stroke by 27% and heart failure by 28%  Your blood pressure goal is <130/80  To check your pressure at home you will need to:  1. Sit up in a chair, with feet flat on the floor and back supported. Do not cross your ankles or legs. 2. Rest your left arm so that the cuff is about heart level. If the cuff goes on your upper arm,  then just relax the arm on the table, arm of the chair or your lap. If you have a wrist cuff, we  suggest relaxing your wrist against your chest (think of it as Pledging the Flag with the  wrong arm).  3. Place the cuff snugly around your arm, about 1 inch above the crook of your elbow. The  cords should be inside the groove of your elbow.  4. Sit quietly, with the cuff in place, for about 5 minutes. After that 5 minutes press the power  button to start a reading. 5. Do not talk or move while  the reading is taking place.  6. Record your readings on a sheet of paper. Although most cuffs have a memory, it is often  easier to see a pattern developing when the numbers are all in front of you.  7. You can repeat the reading after 1-3 minutes if it is recommended  Make sure your bladder is empty and you have not had caffeine or tobacco within the last 30 min  Always bring your blood pressure log with you to your appointments. If you have not brought your monitor in to be double checked for accuracy, please bring it to your next appointment.  You can find a list of quality blood pressure cuffs at [email or web address]

## 2022-06-18 NOTE — Progress Notes (Signed)
Office Visit    Patient Name: Dylan Pierce Date of Encounter: 06/18/2022  Primary Care Provider:  Tally Joe, MD Primary Cardiologist:  None  Chief Complaint    Hypertension  Significant Past Medical History   No heart disease   Allergies  Allergen Reactions   Amlodipine Besylate     Other Reaction(s): tolerates 2.5 mg, but 5 mg led to swelling   Cortisone     Other Reaction(s): joints swells   Lisinopril Cough   Penicillin G     Other Reaction(s): rash, swelling tongue   Sulfa Antibiotics     Other Reaction(s): rash, swelling tongu    History of Present Illness    Dylan Pierce is a 82 y.o. male patient of Dr Bjorn Pippin, in the office today for hypertension follow up.  He was seen by Carmela Hurt in May and in office pressure was 161/81.  Home readings were about 15-20 points lower on validated cuff.  Triamterene/hctz was increased to 75/50 mg and he was asked to return in a month.   Labs done after the dose increase showed an increase in SCr, so the triamterene/hctz was switched to just hctz 25 mg daily.     Blood Pressure Goal:  130/80  Current Medications:  hctz 25 mg, telmisartan 80 mg amlodipine 5 mg  Previously tried:  lisinopril - cough  Family Hx:    Father- HTN Mother: diabetes  Meternal GM- heart disease Maternal GF: stroke  Brothers- died from cancer  Social History:  Alcohol: none Smoking: never    Diet: low salt, low fat diet Cook meals at home, cut down on sweet and salty snacks  Lots of vegetables, chicken, fish- baked and grilled    Exercise: walk/jogging  30 min every day, starches before walking + twice a week gym 45 min (cardio+resistance)  Runs 5K races - has 2-3 coming up this summer/fall  Home BP readings: home cuff validated at last visit (May 24) with < 10 point variation   21 readings AM average 132/69  HR 53 21 readings PM average 133/69  HR 53           Last visit ~ 145/77 range with some readings 126/76      Accessory Clinical Findings    Lab Results  Component Value Date   CREATININE 1.50 (H) 06/03/2022   BUN 21 06/03/2022   NA 141 06/03/2022   K 5.1 06/03/2022   CL 102 06/03/2022   CO2 26 06/03/2022   No results found for: "ALT", "AST", "GGT", "ALKPHOS", "BILITOT" No results found for: "HGBA1C"  Home Medications    Current Outpatient Medications  Medication Sig Dispense Refill   amitriptyline (ELAVIL) 10 MG tablet Take 10 mg by mouth at bedtime.     amLODipine (NORVASC) 5 MG tablet Take 1 tablet (5 mg total) by mouth daily. 90 tablet 3   esomeprazole (NEXIUM) 20 MG capsule Take 20 mg by mouth daily as needed.     hydrochlorothiazide (HYDRODIURIL) 25 MG tablet Take 1 tablet (25 mg total) by mouth daily. 90 tablet 3   latanoprost (XALATAN) 0.005 % ophthalmic solution 1 drop at bedtime.     metroNIDAZOLE (METROGEL) 1 % gel Apply topically daily.     Multiple Vitamins-Minerals (PRESERVISION AREDS 2) CHEW Chew 1 tablet by mouth 2 (two) times daily.     naproxen sodium (ALEVE) 220 MG tablet Take 220 mg by mouth.     telmisartan (MICARDIS) 80 MG tablet Take 80 mg  by mouth daily.     timolol (TIMOPTIC) 0.5 % ophthalmic solution 1 drop 2 (two) times daily.     traMADol (ULTRAM) 50 MG tablet Take by mouth every 6 (six) hours as needed.     No current facility-administered medications for this visit.     HYPERTENSION CONTROL Vitals:   06/18/22 0955 06/18/22 0959  BP: (!) 163/85 (!) 146/79    The patient's blood pressure is elevated above target today.  In order to address the patient's elevated BP: The blood pressure is usually elevated in clinic.  Blood pressures monitored at home have been optimal.      Assessment & Plan    Essential hypertension Assessment: BP is uncontrolled in office BP 146/79 mmHg;  above the goal (<130/80). Home readings, on validated Omron cuff average 132/69 Tolerates current medications well without any side effects Denies SOB, palpitation,  chest pain, headaches,or swelling Praised exercise regimen, including ability to run 5K races   Plan:  Continue taking telmisartan 80 mg, amlodipine 5 mg and hctz 25 mg - move amlodipine to evenings for balance Patient to monitor BP readings with heart rate and report to [place] should they become elevated Patient to follow up with Dr. Bjorn Pippin in July  Labs ordered today:  none   Phillips Hay PharmD CPP [facility]  [address] [place], [place] [address] [phone]

## 2022-07-20 NOTE — Progress Notes (Signed)
Cardiology Office Note:    Date:  07/23/2022   ID:  Dylan Pierce, Dylan Pierce 1940-11-23, MRN [id number]  PCP:  Tally Joe, MD  Cardiologist:  Little Ishikawa, MD  Electrophysiologist:  None   Referring MD: Tally Joe, MD   Chief Complaint  Patient presents with   Hypertension    History of Present Illness:    Dylan Pierce is a 82 y.o. male with a hx of hypertension, CKD stage III who presents for follow-up.  He was referred by Dr. Azucena Cecil for evaluation of hypertension, initially seen 04/02/2022.  BP elevated over 200 when checked recently at Dr. Merita Norton office.  Denies any chest pain, dyspnea, lightheadedness, syncope, lower extremity edema, or palpitations.  Ran 5K on Saturday, no exertional symptoms.  No smoking history.  No history of heart disease in his immediate family.  Reports when he checks BP at home has been 140s over 70s.  Echocardiogram 04/27/2022 showed EF 60 to 65%, grade 1 diastolic dysfunction, normal RV function, no significant valvular disease.  Since last clinic visit, he reports that he has been doing well.  Reports BP has been about 130s over 70s when checks at home.  Denies any chest pain, dyspnea, lightheadedness, syncope, lower extremity edema, or palpitations.  Reports he remains very active, has run four 5Ks in the last few months.  He denies any exertional symptoms.   Past Medical History:  Diagnosis Date   Arthritis    Chronic neck and back pain    Esophageal reflux    Glaucoma    Hypertension    Kidney stones    Rosacea    Shingles    Spinal stenosis    T9 vertebral fracture (HCC)     Past Surgical History:  Procedure Laterality Date   APPENDECTOMY     COLONOSCOPY     ESOPHAGOGASTRODUODENOSCOPY      Current Medications: Current Meds  Medication Sig   amitriptyline (ELAVIL) 10 MG tablet Take 10 mg by mouth at bedtime.   amLODipine (NORVASC) 5 MG tablet Take 1 tablet (5 mg total) by mouth daily.    hydrochlorothiazide (HYDRODIURIL) 25 MG tablet Take 1 tablet (25 mg total) by mouth daily.   latanoprost (XALATAN) 0.005 % ophthalmic solution Place 1 drop into both eyes at bedtime.   Multiple Vitamins-Minerals (PRESERVISION AREDS 2) CHEW Chew 1 tablet by mouth 2 (two) times daily.   telmisartan (MICARDIS) 80 MG tablet Take 80 mg by mouth daily.   timolol (TIMOPTIC) 0.5 % ophthalmic solution Place 1 drop into both eyes 2 (two) times daily.     Allergies:   Amlodipine besylate, Cortisone, Lisinopril, Penicillin g, and Sulfa antibiotics   Social History   Socioeconomic History   Marital status: Married    Spouse name: Not on file   Number of children: Not on file   Years of education: Not on file   Highest education level: Not on file  Occupational History   Not on file  Tobacco Use   Smoking status: Never   Smokeless tobacco: Never  Substance and Sexual Activity   Alcohol use: Not Currently   Drug use: Not on file   Sexual activity: Not on file  Other Topics Concern   Not on file  Social History Narrative   Not on file   Social Determinants of Health   Financial Resource Strain: Not on file  Food Insecurity: Not on file  Transportation Needs: Not on file  Physical Activity: Not on file  Stress: Not on file  Social Connections: Not on file     Family History: The patient's family history includes Alcoholism in his father; Cirrhosis in his mother; Colon cancer in his brother; Diabetes in his brother, maternal grandmother, and mother; Heart attack in his paternal grandfather; Hypertension in his brother, brother, brother, maternal grandfather, maternal grandmother, and paternal grandfather; Melanoma in his brother; Stroke in his maternal grandfather and maternal grandmother.  ROS:   Please see the history of present illness.     All other systems reviewed and are negative.  EKGs/Labs/Other Studies Reviewed:    The following studies were reviewed today:   EKG:    03/25/2022: Normal sinus rhythm, rate 62, no ST abnormality   Recent Labs: 06/03/2022: BUN 21; Creatinine, Ser 1.50; Potassium 5.1; Sodium 141  Recent Lipid Panel No results found for: "CHOL", "TRIG", "HDL", "CHOLHDL", "VLDL", "LDLCALC", "LDLDIRECT"  Physical Exam:    VS:  BP (!) 172/80 (BP Location: Left Arm, Patient Position: Sitting, Cuff Size: Normal)   Pulse 60   Ht 5\' 10"  (1.778 m)   Wt 185 lb 9.6 oz (84.2 kg)   SpO2 97%   BMI 26.63 kg/m     Wt Readings from Last 3 Encounters:  07/23/22 185 lb 9.6 oz (84.2 kg)  04/02/22 186 lb (84.4 kg)  03/24/22 191 lb 12.8 oz (87 kg)     GEN:  Well nourished, well developed in no acute distress HEENT: Normal NECK: No JVD CARDIAC: RRR, no murmurs, rubs, gallops RESPIRATORY:  Clear to auscultation without rales, wheezing or rhonchi  ABDOMEN: Soft, non-tender, non-distended MUSCULOSKELETAL:  No edema; No deformity  SKIN: Warm and dry NEUROLOGIC:  Alert and oriented x 3 PSYCHIATRIC:  Normal affect   ASSESSMENT:    1. Essential hypertension   2. CKD stage 3a, GFR 45-59 ml/min (HCC)     PLAN:    Hypertension: On telmisartan 80 mg daily, amlodipine 5 mg daily, HCTZ 25 mg daily.  Previously on triamterene-HCTZ, and 22 was discontinued due to hyperkalemia.  BP elevated in clinic but improved on home monitor, which was validated in office.  Asked him to check BP twice daily for next week and let us know results.  Check BMET/magnesium  CKD stage IIIa: Creatinine 1.5 on 06/03/2022.  Check BMET  RTC in 6 months   Medication Adjustments/Labs and Tests Ordered: Current medicines are reviewed at length with the patient today.  Concerns regarding medicines are outlined above.  Orders Placed This Encounter  Procedures   Basic metabolic panel   Magnesium   No orders of the defined types were placed in this encounter.   Patient Instructions   Other Instructions    Monitor your blood pressure  twice a day for one week , send a copy  of readings to Dr Bjorn Pippin through [place].   Medication Instructions:  Not needed  *If you need a refill on your cardiac medications before your next appointment, please call your pharmacy*   Lab Work: BMP Magnesium  If you have labs (blood work) drawn today and your tests are completely normal, you will receive your results only by: MyChart Message (if you have MyChart) OR A paper copy in the mail If you have any lab test that is abnormal or we need to change your treatment, we will call you to review the results.   Testing/Procedures: Not needed   Follow-Up: At [facility], you and your health needs are our priority.  As part of our continuing mission  to provide you with exceptional heart care, we have created designated Provider Care Teams.  These Care Teams include your primary Cardiologist (physician) and Advanced Practice Providers (APPs -  Physician Assistants and Nurse Practitioners) who all work together to provide you with the care you need, when you need it.     Your next appointment:   6 month(s)  The format for your next appointment:   In Person  Provider:   Little Ishikawa, MD    Other Instructions    Monitor your blood pressure  twice a day for one week , send a copy of readings to Dr Bjorn Pippin through [place].   Signed, Little Ishikawa, MD  07/23/2022 5:20 PM    [facility]

## 2022-07-21 DIAGNOSIS — H401313 Pigmentary glaucoma, right eye, severe stage: Secondary | ICD-10-CM | POA: Diagnosis not present

## 2022-07-23 ENCOUNTER — Encounter: Payer: Self-pay | Admitting: Cardiology

## 2022-07-23 ENCOUNTER — Ambulatory Visit: Payer: Medicare Other | Attending: Cardiology | Admitting: Cardiology

## 2022-07-23 VITALS — BP 172/80 | HR 60 | Ht 70.0 in | Wt 185.6 lb

## 2022-07-23 DIAGNOSIS — I10 Essential (primary) hypertension: Secondary | ICD-10-CM | POA: Diagnosis not present

## 2022-07-23 DIAGNOSIS — N1831 Chronic kidney disease, stage 3a: Secondary | ICD-10-CM | POA: Diagnosis not present

## 2022-07-23 NOTE — Patient Instructions (Addendum)
  Other Instructions    Monitor your blood pressure  twice a day for one week , send a copy of readings to Dr Bjorn Pippin through [place].   Medication Instructions:  Not needed  *If you need a refill on your cardiac medications before your next appointment, please call your pharmacy*   Lab Work: BMP Magnesium  If you have labs (blood work) drawn today and your tests are completely normal, you will receive your results only by: MyChart Message (if you have MyChart) OR A paper copy in the mail If you have any lab test that is abnormal or we need to change your treatment, we will call you to review the results.   Testing/Procedures: Not needed   Follow-Up: At [facility], you and your health needs are our priority.  As part of our continuing mission to provide you with exceptional heart care, we have created designated Provider Care Teams.  These Care Teams include your primary Cardiologist (physician) and Advanced Practice Providers (APPs -  Physician Assistants and Nurse Practitioners) who all work together to provide you with the care you need, when you need it.     Your next appointment:   6 month(s)  The format for your next appointment:   In Person  Provider:   Little Ishikawa, MD    Other Instructions    Monitor your blood pressure  twice a day for one week , send a copy of readings to Dr Bjorn Pippin through [place].

## 2022-07-24 LAB — BASIC METABOLIC PANEL
BUN/Creatinine Ratio: 14 (ref 10–24)
BUN: 19 mg/dL (ref 8–27)
CO2: 26 mmol/L (ref 20–29)
Calcium: 9.4 mg/dL (ref 8.6–10.2)
Chloride: 101 mmol/L (ref 96–106)
Creatinine, Ser: 1.34 mg/dL — ABNORMAL HIGH (ref 0.76–1.27)
Glucose: 74 mg/dL (ref 70–99)
Potassium: 5 mmol/L (ref 3.5–5.2)
Sodium: 140 mmol/L (ref 134–144)
eGFR: 53 mL/min/{1.73_m2} — ABNORMAL LOW (ref >59)

## 2022-07-24 LAB — MAGNESIUM: Magnesium: 2 mg/dL (ref 1.6–2.3)

## 2022-08-03 ENCOUNTER — Encounter: Payer: Self-pay | Admitting: Cardiology

## 2022-09-04 ENCOUNTER — Other Ambulatory Visit (HOSPITAL_BASED_OUTPATIENT_CLINIC_OR_DEPARTMENT_OTHER): Payer: Self-pay

## 2022-09-04 MED ORDER — FLUAD 0.5 ML IM SUSY
PREFILLED_SYRINGE | INTRAMUSCULAR | 0 refills | Status: AC
  Filled 2022-09-04: qty 0.5, 1d supply, fill #0

## 2022-09-09 ENCOUNTER — Other Ambulatory Visit (HOSPITAL_BASED_OUTPATIENT_CLINIC_OR_DEPARTMENT_OTHER): Payer: Self-pay

## 2022-09-28 DIAGNOSIS — D696 Thrombocytopenia, unspecified: Secondary | ICD-10-CM | POA: Diagnosis not present

## 2022-09-28 DIAGNOSIS — G4700 Insomnia, unspecified: Secondary | ICD-10-CM | POA: Diagnosis not present

## 2022-09-28 DIAGNOSIS — N1831 Chronic kidney disease, stage 3a: Secondary | ICD-10-CM | POA: Diagnosis not present

## 2022-09-28 DIAGNOSIS — I10 Essential (primary) hypertension: Secondary | ICD-10-CM | POA: Diagnosis not present

## 2022-10-08 DIAGNOSIS — K080 Exfoliation of teeth due to systemic causes: Secondary | ICD-10-CM | POA: Diagnosis not present

## 2022-10-19 DIAGNOSIS — K080 Exfoliation of teeth due to systemic causes: Secondary | ICD-10-CM | POA: Diagnosis not present

## 2022-10-27 ENCOUNTER — Other Ambulatory Visit (HOSPITAL_BASED_OUTPATIENT_CLINIC_OR_DEPARTMENT_OTHER): Payer: Self-pay

## 2022-10-27 MED ORDER — COVID-19 MRNA VAC-TRIS(PFIZER) 30 MCG/0.3ML IM SUSY
0.3000 mL | PREFILLED_SYRINGE | Freq: Once | INTRAMUSCULAR | 0 refills | Status: AC
  Filled 2022-10-27: qty 0.3, 1d supply, fill #0

## 2023-01-29 DIAGNOSIS — H353112 Nonexudative age-related macular degeneration, right eye, intermediate dry stage: Secondary | ICD-10-CM | POA: Diagnosis not present

## 2023-01-29 DIAGNOSIS — H401331 Pigmentary glaucoma, bilateral, mild stage: Secondary | ICD-10-CM | POA: Diagnosis not present

## 2023-01-29 DIAGNOSIS — H04223 Epiphora due to insufficient drainage, bilateral lacrimal glands: Secondary | ICD-10-CM | POA: Diagnosis not present

## 2023-01-29 DIAGNOSIS — H35372 Puckering of macula, left eye: Secondary | ICD-10-CM | POA: Diagnosis not present

## 2023-03-14 ENCOUNTER — Encounter (HOSPITAL_BASED_OUTPATIENT_CLINIC_OR_DEPARTMENT_OTHER): Payer: Self-pay

## 2023-03-14 ENCOUNTER — Emergency Department (HOSPITAL_BASED_OUTPATIENT_CLINIC_OR_DEPARTMENT_OTHER)
Admission: EM | Admit: 2023-03-14 | Discharge: 2023-03-14 | Disposition: A | Discharge status: Home / Self Care | Attending: Emergency Medicine | Admitting: Emergency Medicine

## 2023-03-14 ENCOUNTER — Other Ambulatory Visit: Payer: Self-pay

## 2023-03-14 DIAGNOSIS — Z79899 Other long term (current) drug therapy: Secondary | ICD-10-CM | POA: Insufficient documentation

## 2023-03-14 DIAGNOSIS — I10 Essential (primary) hypertension: Secondary | ICD-10-CM | POA: Insufficient documentation

## 2023-03-14 DIAGNOSIS — R040 Epistaxis: Secondary | ICD-10-CM | POA: Insufficient documentation

## 2023-03-14 MED ORDER — OXYMETAZOLINE HCL 0.05 % NA SOLN
1.0000 | Freq: Once | NASAL | Status: AC
  Administered 2023-03-14: 1 via NASAL
  Filled 2023-03-14: qty 30

## 2023-03-14 NOTE — ED Provider Notes (Signed)
 [place] EMERGENCY DEPARTMENT AT [facility] Provider Note   CSN: [id number] Arrival date & time: 03/14/23  1451     History  Chief Complaint  Patient presents with   Epistaxis    Dylan Pierce is a 83 y.o. male.  Patient is an 83 yo male presenting for epistaxis. Hx of epistaxis with ablation on the left several years ago. No blood thinner use. Hx of HTN but well controlled on medications. Patient's current nose bleed was provoked by leaning forward. Has been using humidifier in home. No nasal sprays.   The history is provided by the patient. No language interpreter was used.  Epistaxis Associated symptoms: no cough, no fever and no sore throat        Home Medications Prior to Admission medications   Medication Sig Start Date End Date Taking? Authorizing Provider  amitriptyline (ELAVIL) 10 MG tablet Take 10 mg by mouth at bedtime.    [provider]  amLODipine (NORVASC) 5 MG tablet Take 1 tablet (5 mg total) by mouth daily. 04/02/22   Little Ishikawa, MD  esomeprazole (NEXIUM) 20 MG capsule Take 20 mg by mouth daily as needed. Patient not taking: Reported on 07/23/2022    [provider]  hydrochlorothiazide (HYDRODIURIL) 25 MG tablet Take 1 tablet (25 mg total) by mouth daily. 05/25/22 08/23/22  Little Ishikawa, MD  influenza vaccine adjuvanted (FLUAD) 0.5 ML injection Inject into the muscle. 09/04/22     latanoprost (XALATAN) 0.005 % ophthalmic solution Place 1 drop into both eyes at bedtime.    [provider]  metroNIDAZOLE (METROGEL) 1 % gel Apply topically daily. Patient not taking: Reported on 07/23/2022    [provider]  Multiple Vitamins-Minerals (PRESERVISION AREDS 2) CHEW Chew 1 tablet by mouth 2 (two) times daily.    [provider]  naproxen sodium (ALEVE) 220 MG tablet Take 220 mg by mouth. Patient not taking: Reported on 07/23/2022    [provider]  telmisartan (MICARDIS) 80  MG tablet Take 80 mg by mouth daily.    [provider]  timolol (TIMOPTIC) 0.5 % ophthalmic solution Place 1 drop into both eyes 2 (two) times daily.    [provider]  traMADol (ULTRAM) 50 MG tablet Take by mouth every 6 (six) hours as needed. Patient not taking: Reported on 07/23/2022    [provider]      Allergies    Amlodipine besylate, Cortisone, Lisinopril, Penicillin g, and Sulfa antibiotics    Review of Systems   Review of Systems  Constitutional:  Negative for chills and fever.  HENT:  Positive for nosebleeds. Negative for ear pain and sore throat.   Eyes:  Negative for pain and visual disturbance.  Respiratory:  Negative for cough and shortness of breath.   Cardiovascular:  Negative for chest pain and palpitations.  Gastrointestinal:  Negative for abdominal pain and vomiting.  Genitourinary:  Negative for dysuria and hematuria.  Musculoskeletal:  Negative for arthralgias and back pain.  Skin:  Negative for color change and rash.  Neurological:  Negative for seizures and syncope.  All other systems reviewed and are negative.   Physical Exam Updated Vital Signs BP (!) 176/94 (BP Location: Right Arm)   Pulse 69   Temp 98.4 F (36.9 C) (Temporal)   Resp 20   SpO2 100%  Physical Exam Vitals and nursing note reviewed.  Constitutional:      General: He is not in acute distress.    Appearance:  He is well-developed.  HENT:     Head: Normocephalic and atraumatic.     Nose:     Left Nostril: Epistaxis present.     Comments: Epistaxis with large clot in left nare.  Eyes:     Conjunctiva/sclera: Conjunctivae normal.  Cardiovascular:     Rate and Rhythm: Normal rate and regular rhythm.     Heart sounds: No murmur heard. Pulmonary:     Effort: Pulmonary effort is normal. No respiratory distress.     Breath sounds: Normal breath sounds.  Abdominal:     Palpations: Abdomen is soft.     Tenderness: There is no abdominal tenderness.   Musculoskeletal:        General: No swelling.     Cervical back: Neck supple.  Skin:    General: Skin is warm and dry.     Capillary Refill: Capillary refill takes less than 2 seconds.  Neurological:     Mental Status: He is alert.  Psychiatric:        Mood and Affect: Mood normal.     ED Results / Procedures / Treatments   Labs (all labs ordered are listed, but only abnormal results are displayed) Labs Reviewed - No data to display  EKG None  Radiology No results found.  Procedures Procedures    Medications Ordered in ED Medications  oxymetazoline (AFRIN) 0.05 % nasal spray 1 spray (1 spray Each Nare Given 03/14/23 1502)    ED Course/ Medical Decision Making/ A&P                                 Medical Decision Making Risk OTC drugs.   83 yo male presenting for epistaxis. Patient is Aox3, no acute distress, with stable vitals. Physical exam demonstrates 83 yo male presenting for epistaxis. Epistaxis with large clot in left nare. Bleed is coming from a posterior location-unable to visualize. Afrin and compression has controlled bleed at this time. Will monitor for 15-20 min for re-bleed.   Patient monitored for continued 25 minutes with no repeat bleeds.  Patient sent home with Afrin and recommendations for 2 to 4 sprays and compression if bleeding reoccurs.  Recommended to come to ED if bleeding reoccurs and is not controlled with those medications.  Recommend follow-up with ENT for possible cauterization.  At this time I am unable to locate area of bleeding likely because of its posterior nature.  Patient in no distress and overall condition improved here in the ED. Detailed discussions were had with the patient regarding current findings, and need for close f/u with PCP or on call doctor. The patient has been instructed to return immediately if the symptoms worsen in any way for re-evaluation. Patient verbalized understanding and is in agreement with current care  plan. All questions answered prior to discharge.        Final Clinical Impression(s) / ED Diagnoses Final diagnoses:  None    Rx / DC Orders ED Discharge Orders     None         Franne Forts, DO 03/15/23 0002

## 2023-03-14 NOTE — ED Triage Notes (Signed)
 Arrives POV with complaints of sudden onset of nose bleed. Patient states that this is a recurrent issue and he had a cauterization 2 years ago.

## 2023-03-14 NOTE — Discharge Instructions (Addendum)
 Make you do not blow your nose today try not to lean forward.  If bleeding reoccurs use 2 to 4 sprays of Afrin nasal spray in the affected nostril and apply compression for 10 to 15 minutes.  If you are unable to control the bleeding return to emergency department for nasal rocket-nasal bleeding stabilization device.

## 2023-03-14 NOTE — ED Notes (Signed)
 MD at bedside.

## 2023-03-19 ENCOUNTER — Telehealth (INDEPENDENT_AMBULATORY_CARE_PROVIDER_SITE_OTHER): Payer: Self-pay | Admitting: Otolaryngology

## 2023-03-19 NOTE — Telephone Encounter (Signed)
 Confirmed appt & location [id number]

## 2023-03-22 ENCOUNTER — Ambulatory Visit (INDEPENDENT_AMBULATORY_CARE_PROVIDER_SITE_OTHER)

## 2023-03-22 ENCOUNTER — Encounter (INDEPENDENT_AMBULATORY_CARE_PROVIDER_SITE_OTHER): Payer: Self-pay

## 2023-03-22 VITALS — BP 177/72 | HR 60 | Ht 71.0 in | Wt 184.0 lb

## 2023-03-22 DIAGNOSIS — R040 Epistaxis: Secondary | ICD-10-CM | POA: Diagnosis not present

## 2023-03-23 NOTE — Progress Notes (Signed)
 Patient ID: Dylan Pierce, male   DOB: 01-Dec-1940, 83 y.o.   MRN: [id number]  CC: Recurrent left epistaxis  HPI:  Dylan Pierce is a 83 y.o. male who presents today complaining of recurrent left epistaxis since 2005.  The severity and frequency of his nasal bleeding has increased over the past few months.  He had an episode of severe bleeding 1 week ago, requiring treatment at an emergency room.  He denies any recent facial or nasal trauma.  He is not on any blood thinner.  He also denies any history of clotting disorder.  He has a history of hypertension, and is on multiple hypertensive medications.  He has no previous nasal surgery.  Past Medical History:  Diagnosis Date   Arthritis    Chronic neck and back pain    Esophageal reflux    Glaucoma    Hypertension    Kidney stones    Rosacea    Shingles    Spinal stenosis    T9 vertebral fracture (HCC)     Past Surgical History:  Procedure Laterality Date   APPENDECTOMY     COLONOSCOPY     ESOPHAGOGASTRODUODENOSCOPY      Family History  Problem Relation Age of Onset   Diabetes Mother    Cirrhosis Mother    Alcoholism Father    Diabetes Brother    Hypertension Brother    Hypertension Brother    Colon cancer Brother    Hypertension Brother    Melanoma Brother    Stroke Maternal Grandmother    Diabetes Maternal Grandmother    Hypertension Maternal Grandmother    Stroke Maternal Grandfather    Hypertension Maternal Grandfather    Heart attack Paternal Grandfather    Hypertension Paternal Grandfather     Social History:  reports that he has never smoked. He has never used smokeless tobacco. He reports that he does not currently use alcohol. No history on file for drug use.  Allergies:  Allergies  Allergen Reactions   Amlodipine Besylate     Other Reaction(s): tolerates 2.5 mg, but 5 mg led to swelling   Cortisone     Other Reaction(s): joints swells   Lisinopril Cough   Penicillin G     Other  Reaction(s): rash, swelling tongue   Sulfa Antibiotics     Other Reaction(s): rash, swelling tongu    Prior to Admission medications   Medication Sig Start Date End Date Taking? Authorizing Provider  amitriptyline (ELAVIL) 10 MG tablet Take 10 mg by mouth at bedtime.   Yes [provider]  amLODipine (NORVASC) 5 MG tablet Take 1 tablet (5 mg total) by mouth daily. 04/02/22  Yes Little Ishikawa, MD  esomeprazole (NEXIUM) 20 MG capsule Take 20 mg by mouth daily as needed.   Yes [provider]  influenza vaccine adjuvanted (FLUAD) 0.5 ML injection Inject into the muscle. 09/04/22  Yes   latanoprost (XALATAN) 0.005 % ophthalmic solution Place 1 drop into both eyes at bedtime.   Yes [provider]  metroNIDAZOLE (METROGEL) 1 % gel Apply topically daily.   Yes [provider]  Multiple Vitamins-Minerals (PRESERVISION AREDS 2) CHEW Chew 1 tablet by mouth 2 (two) times daily.   Yes [provider]  naproxen sodium (ALEVE) 220 MG tablet Take 220 mg by mouth.   Yes [provider]  telmisartan (MICARDIS) 80 MG tablet Take 80 mg by mouth daily.   Yes [provider]  timolol (TIMOPTIC) 0.5 % ophthalmic  solution Place 1 drop into both eyes 2 (two) times daily.   Yes [provider]  traMADol (ULTRAM) 50 MG tablet Take by mouth every 6 (six) hours as needed.   Yes [provider]  hydrochlorothiazide (HYDRODIURIL) 25 MG tablet Take 1 tablet (25 mg total) by mouth daily. 05/25/22 08/23/22  Little Ishikawa, MD    Blood pressure (!) 177/72, pulse 60, height 5\' 11"  (1.803 m), weight 184 lb (83.5 kg), SpO2 95%. Exam: General: Communicates without difficulty, well nourished, no acute distress. Head: Normocephalic, no evidence injury, no tenderness, facial buttresses intact without stepoff. Face/sinus: No tenderness to palpation and percussion. Facial movement is normal and symmetric. Eyes: PERRL, EOMI. No scleral  icterus, conjunctivae clear. Neuro: CN II exam reveals vision grossly intact.  No nystagmus at any point of gaze. Ears: Auricles well formed without lesions.  Ear canals are intact without mass or lesion.  No erythema or edema is appreciated.  The TMs are intact without fluid. Nose: External evaluation reveals normal support and skin without lesions.  Dorsum is intact.  Anterior rhinoscopy reveals hypervascular areas on the left nasal septum.  Oral:  Oral cavity and oropharynx are intact, symmetric, without erythema or edema.  Mucosa is moist without lesions. Neck: Full range of motion without pain.  There is no significant lymphadenopathy.  No masses palpable.  Thyroid bed within normal limits to palpation.  Parotid glands and submandibular glands equal bilaterally without mass.  Trachea is midline. Neuro:  CN 2-12 grossly intact.   Procedure:  Endoscopic control of recurrent left epistaxis. Indication:  Recurrent epistaxis  Description:  The left nasal cavity is sprayed with topical xylocaine and neo-synephrine.  After adequate anesthesia is achieved, the nasal cavity is examined with a 0 rigid endoscope.  A suction catheter is inserted into parallel with the 0 endoscope, and it is used to suction blood clots from the nasal cavity.  Several hypervascular areas are noted on the anterior and superior portion of the septum. Active bleeding is noted. A silver nitrate stick is inserted in parallel with the 0 endoscope.  It is used to repeatedly cauterized the hypervascular areas.  Good hemostasis is achieved.  The patient tolerated the procedure well.   Assessment: 1.  Recurrent left epistaxis.  Hypervascular areas are noted on the left anterior and superior nasal septum. 2.  No suspicious mass, lesion, or infection is noted.  Plan: 1.  The physical exam and nasal endoscopy findings are reviewed with the patient. 2.  Endoscopic cauterization of the left nasal septum. 3.  Nasal ointment and humidifier  during the winter months. 4.  The patient will return for reevaluation in 3 weeks.  Kaysen Deal W Kambree Krauss 03/23/2023, 10:18 AM

## 2023-04-12 ENCOUNTER — Ambulatory Visit (INDEPENDENT_AMBULATORY_CARE_PROVIDER_SITE_OTHER)

## 2023-04-12 VITALS — BP 170/81 | HR 57 | Ht 71.0 in | Wt 184.0 lb

## 2023-04-12 DIAGNOSIS — R040 Epistaxis: Secondary | ICD-10-CM | POA: Diagnosis not present

## 2023-04-12 NOTE — Progress Notes (Unsigned)
 Patient ID: Dylan Pierce, male   DOB: 05/16/1940, 83 y.o.   MRN: [id number]  Follow up: More recurrent left epistaxis  Procedure:  Endoscopic control of recurrent left epistaxis  Indication: The patient is an 83 year old male who returns today for his follow-up evaluation.  The patient was last seen 1 month ago.  At that time, he was complaining of recurrent left epistaxis.  He was treated with endoscopic left nasal cautery.  According to the patient, he had 3 additional episodes of left-sided nasal bleeding last month.  The severity of the bleeding has decreased.  He denies any recent nasal trauma.  He is able to breathe through both nostrils.  Description:  The left nasal cavity is sprayed with topical xylocaine and neo-synephrine.  After adequate anesthesia is achieved, the nasal cavity is examined with a 0 rigid endoscope.  A suction catheter is inserted in parallel with the 0 endoscope, and it is used to suction blood clots from the left nasal cavity.  Hypervascular areas are noted at the anterior and superior aspect of the nasal septum.  A silver nitrate stick is inserted in parallel with the 0 endoscope.  It is used to repeatedly cauterized the hypervascular areas.  Good hemostasis is achieved.  The patient tolerated the procedure well.  Assessment: 1.  Hypervascular areas are again noted on the left anterior and superior nasal septum.   2.  Recurrent left epistaxis.  Plan: 1. Endoscopic cauterization of the left anterior and superior nasal septum. 2. The nasal endoscopy findings are reviewed with the patient. 3. Nasal ointment/humidifier to treat the nasal dryness. 4. The patient is encouraged to call with questions or concerns.

## 2023-05-04 DIAGNOSIS — Z1331 Encounter for screening for depression: Secondary | ICD-10-CM | POA: Diagnosis not present

## 2023-05-04 DIAGNOSIS — N1831 Chronic kidney disease, stage 3a: Secondary | ICD-10-CM | POA: Diagnosis not present

## 2023-05-04 DIAGNOSIS — G4700 Insomnia, unspecified: Secondary | ICD-10-CM | POA: Diagnosis not present

## 2023-05-04 DIAGNOSIS — L719 Rosacea, unspecified: Secondary | ICD-10-CM | POA: Diagnosis not present

## 2023-05-04 DIAGNOSIS — D696 Thrombocytopenia, unspecified: Secondary | ICD-10-CM | POA: Diagnosis not present

## 2023-05-04 DIAGNOSIS — Z0000 Encounter for general adult medical examination without abnormal findings: Secondary | ICD-10-CM | POA: Diagnosis not present

## 2023-05-04 DIAGNOSIS — I10 Essential (primary) hypertension: Secondary | ICD-10-CM | POA: Diagnosis not present

## 2023-05-05 DIAGNOSIS — K080 Exfoliation of teeth due to systemic causes: Secondary | ICD-10-CM | POA: Diagnosis not present

## 2023-07-30 DIAGNOSIS — H401313 Pigmentary glaucoma, right eye, severe stage: Secondary | ICD-10-CM | POA: Diagnosis not present

## 2023-09-17 ENCOUNTER — Other Ambulatory Visit (HOSPITAL_BASED_OUTPATIENT_CLINIC_OR_DEPARTMENT_OTHER): Payer: Self-pay

## 2023-09-17 MED ORDER — FLUZONE HIGH-DOSE 0.5 ML IM SUSY
0.5000 mL | PREFILLED_SYRINGE | Freq: Once | INTRAMUSCULAR | 0 refills | Status: AC
  Filled 2023-09-17: qty 0.5, 1d supply, fill #0

## 2023-09-21 ENCOUNTER — Other Ambulatory Visit (HOSPITAL_BASED_OUTPATIENT_CLINIC_OR_DEPARTMENT_OTHER): Payer: Self-pay

## 2023-09-21 MED ORDER — COMIRNATY 30 MCG/0.3ML IM SUSY
0.3000 mL | PREFILLED_SYRINGE | Freq: Once | INTRAMUSCULAR | 0 refills | Status: AC
Start: 2023-09-21 — End: 2023-09-22
  Filled 2023-09-21: qty 0.3, 1d supply, fill #0

## 2023-11-05 DIAGNOSIS — I10 Essential (primary) hypertension: Secondary | ICD-10-CM | POA: Diagnosis not present

## 2023-11-05 DIAGNOSIS — D696 Thrombocytopenia, unspecified: Secondary | ICD-10-CM | POA: Diagnosis not present

## 2023-11-05 DIAGNOSIS — G4700 Insomnia, unspecified: Secondary | ICD-10-CM | POA: Diagnosis not present

## 2023-11-05 DIAGNOSIS — L719 Rosacea, unspecified: Secondary | ICD-10-CM | POA: Diagnosis not present

## 2023-11-05 DIAGNOSIS — M545 Low back pain, unspecified: Secondary | ICD-10-CM | POA: Diagnosis not present

## 2023-11-25 DIAGNOSIS — K080 Exfoliation of teeth due to systemic causes: Secondary | ICD-10-CM | POA: Diagnosis not present
